# Patient Record
Sex: Female | Born: 1990 | Race: White | Hispanic: No | Marital: Single | Smoking: Current every day smoker
Health system: Southern US, Community
[De-identification: ages and names within clinical notes are randomized; demographics above are authoritative.]

## PROBLEM LIST (undated history)

## (undated) DIAGNOSIS — F419 Anxiety disorder, unspecified: Secondary | ICD-10-CM

## (undated) DIAGNOSIS — B009 Herpesviral infection, unspecified: Secondary | ICD-10-CM

## (undated) DIAGNOSIS — J45909 Unspecified asthma, uncomplicated: Secondary | ICD-10-CM

## (undated) DIAGNOSIS — E039 Hypothyroidism, unspecified: Secondary | ICD-10-CM

## (undated) DIAGNOSIS — G4489 Other headache syndrome: Secondary | ICD-10-CM

## (undated) DIAGNOSIS — G4700 Insomnia, unspecified: Secondary | ICD-10-CM

## (undated) HISTORY — PX: MYRINGOTOMY: SUR874

## (undated) HISTORY — DX: Insomnia, unspecified: G47.00

## (undated) HISTORY — DX: Herpesviral infection, unspecified: B00.9

## (undated) HISTORY — DX: Hypothyroidism, unspecified: E03.9

## (undated) HISTORY — DX: Other headache syndrome: G44.89

## (undated) HISTORY — DX: Anxiety disorder, unspecified: F41.9

## (undated) HISTORY — PX: ADENOIDECTOMY: SUR15

---
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
## 2012-06-07 ENCOUNTER — Ambulatory Visit (INDEPENDENT_AMBULATORY_CARE_PROVIDER_SITE_OTHER): Payer: 59 | Admitting: Internal Medicine

## 2012-06-07 VITALS — BP 114/72 | HR 68 | Temp 98.1°F | Resp 16 | Ht 64.5 in | Wt 193.0 lb

## 2012-06-07 DIAGNOSIS — Z6832 Body mass index (BMI) 32.0-32.9, adult: Secondary | ICD-10-CM

## 2012-06-07 DIAGNOSIS — E049 Nontoxic goiter, unspecified: Secondary | ICD-10-CM

## 2012-06-07 DIAGNOSIS — E039 Hypothyroidism, unspecified: Secondary | ICD-10-CM

## 2012-06-07 LAB — POCT CBC
Granulocyte percent: 62.2 %G (ref 37–80)
HCT, POC: 44 % (ref 37.7–47.9)
Hemoglobin: 13.8 g/dL (ref 12.2–16.2)
Lymph, poc: 2.7 (ref 0.6–3.4)
MCH, POC: 27.3 pg (ref 27–31.2)
MCHC: 31.4 g/dL — AB (ref 31.8–35.4)
MCV: 87 fL (ref 80–97)
MID (cbc): 0.7 (ref 0–0.9)
MPV: 12 fL (ref 0–99.8)
POC Granulocyte: 5.5 (ref 2–6.9)
POC LYMPH PERCENT: 30.2 %L (ref 10–50)
POC MID %: 7.6 %M (ref 0–12)
Platelet Count, POC: 267 10*3/uL (ref 142–424)
RBC: 5.06 M/uL (ref 4.04–5.48)
RDW, POC: 14.2 %
WBC: 8.9 10*3/uL (ref 4.6–10.2)

## 2012-06-07 MED ORDER — TRAZODONE HCL 50 MG PO TABS: 50.0000 mg | ORAL_TABLET | Freq: Every evening | ORAL | Status: DC | PRN

## 2012-06-07 MED ORDER — KETOCONAZOLE 2 % EX CREA: TOPICAL_CREAM | Freq: Every day | CUTANEOUS | Status: AC

## 2012-06-07 MED ORDER — BUPROPION HCL ER (XL) 150 MG PO TB24: ORAL_TABLET | ORAL | Status: DC

## 2012-06-07 NOTE — Progress Notes (Signed)
  Subjective:    Patient ID: Hailey Rowland, female    DOB: 1991-06-11, 21 y.o.   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 Negative for activity change and appetite change.  Eyes: Negative for visual disturbance.  Respiratory: Negative for apnea, choking, shortness of breath and wheezing.   Cardiovascular: Negative for chest pain, palpitations and leg swelling.  Gastrointestinal: Negative for constipation.  Genitourinary: Negative for difficulty urinating.  Skin: Negative for rash.  Neurological: Negative for headaches.  Psychiatric/Behavioral: Positive for disturbed wake/sleep cycle and dysphoric mood. Negative for suicidal ideas, hallucinations, behavioral problems, confusion, self-injury, decreased concentration and agitation. The patient is nervous/anxious. The patient is not hyperactive.        Objective:   Physical ExamVital signs stable Pupils equal round reactive to light and accommodation/EOMs conjugate Large goiter but symmetrical without nodules Lungs clear/heart regular Affect appropriate/mood stable        Results for orders placed in visit on 06/07/12  POCT CBC      Component Value Range   WBC 8.9  4.6 - 10.2 K/uL   Lymph, poc 2.7  0.6 - 3.4   POC LYMPH PERCENT 30.2  10 - 50 %L   MID (cbc) 0.7  0 - 0.9   POC MID % 7.6  0 - 12 %M   POC Granulocyte 5.5  2 - 6.9   Granulocyte percent 62.2  37 - 80 %G   RBC 5.06  4.04 - 5.48 M/uL   Hemoglobin 13.8  12.2 - 16.2 g/dL   HCT, POC 40.9  81.1 - 47.9 %   MCV 87.0  80 - 97 fL   MCH, POC 27.3  27 -  31.2 pg   MCHC 31.4 (*) 31.8 - 35.4 g/dL   RDW, POC 91.4     Platelet Count, POC 267  142 - 424 K/uL   MPV 12.0  0 - 99.8 fL    Assessment & Plan:   1. Hypothyroidism  TSH, T4, free, Lipid panel, US Soft Tissue Head/Neck  2. Goiter  TSH, T4, free, US Soft Tissue Head/Neck  3. BMI 32.0-32.9,adult  POCT CBC, Comprehensive metabolic panel  4. Depression 5. Insomnia 6. Allergic rhinitis  Meds ordered this encounter  Medications  . levothyroxine (SYNTHROID, LEVOTHROID) 200 MCG tablet    Sig: Take 200 mcg by mouth daily.  . montelukast (SINGULAIR) 10 MG tablet    Sig: Take 10 mg by mouth at bedtime.  Marland Kitchen buPROPion (WELLBUTRIN XL) 150 MG 24 hr tablet    Sig: One daily for 4 days then two daily    Dispense:  60 tablet    Refill:  2  . traZODone (DESYREL) 50 MG tablet    Sig: Take 1 tablet (50 mg total) by mouth at bedtime as needed for sleep.    Dispense:  30 tablet    Refill:  2  . ketoconazole (NIZORAL) 2 % cream    Sig: Apply topically daily.    Dispense:  15 g    Refill:  0  Call with lab results and plan Ultrasound thyroid

## 2012-06-08 LAB — LIPID PANEL
Cholesterol: 139 mg/dL (ref 0–169)
HDL: 51 mg/dL (ref >34)
LDL Cholesterol: 75 mg/dL (ref 0–109)
Total CHOL/HDL Ratio: 2.7 Ratio
Triglycerides: 63 mg/dL (ref <150)
VLDL: 13 mg/dL (ref 0–40)

## 2012-06-08 LAB — COMPREHENSIVE METABOLIC PANEL
ALT: 19 U/L (ref 0–35)
AST: 23 U/L (ref 0–37)
Albumin: 4.6 g/dL (ref 3.5–5.2)
Alkaline Phosphatase: 83 U/L (ref 39–117)
BUN: 8 mg/dL (ref 6–23)
CO2: 26 mEq/L (ref 19–32)
Calcium: 10.1 mg/dL (ref 8.4–10.5)
Chloride: 103 mEq/L (ref 96–112)
Creat: 0.9 mg/dL (ref 0.50–1.10)
Glucose, Bld: 85 mg/dL (ref 70–99)
Potassium: 4.7 mEq/L (ref 3.5–5.3)
Sodium: 138 mEq/L (ref 135–145)
Total Bilirubin: 0.5 mg/dL (ref 0.3–1.2)
Total Protein: 7.9 g/dL (ref 6.0–8.3)

## 2012-06-08 LAB — T4, FREE: Free T4: 0.96 ng/dL (ref 0.80–1.80)

## 2012-06-08 LAB — TSH: TSH: 12.051 u[IU]/mL — ABNORMAL HIGH (ref 0.350–4.500)

## 2012-06-10 ENCOUNTER — Encounter: Payer: Self-pay | Admitting: Internal Medicine

## 2012-06-10 ENCOUNTER — Telehealth: Payer: Self-pay | Admitting: Internal Medicine

## 2012-06-10 NOTE — Telephone Encounter (Signed)
The numbers in his demographic profile were all wrong side discontinued all of them and I called the number and other chart=[phone] And left a message For them to confirm 200 mcg daily and that she has been taking it regularly as we're awaiting to schedule the ultrasound to reevaluate her goiter

## 2012-08-10 ENCOUNTER — Other Ambulatory Visit: Payer: Self-pay | Admitting: Physician Assistant

## 2012-10-03 ENCOUNTER — Other Ambulatory Visit: Payer: Self-pay | Admitting: Physician Assistant

## 2012-11-05 ENCOUNTER — Other Ambulatory Visit: Payer: Self-pay | Admitting: *Deleted

## 2012-11-05 MED ORDER — LEVOTHYROXINE SODIUM 200 MCG PO TABS: 200.0000 ug | ORAL_TABLET | Freq: Every day | ORAL | Status: DC

## 2012-12-14 ENCOUNTER — Other Ambulatory Visit: Payer: Self-pay | Admitting: Internal Medicine

## 2012-12-16 ENCOUNTER — Encounter: Payer: Self-pay | Admitting: Family Medicine

## 2012-12-16 ENCOUNTER — Ambulatory Visit (INDEPENDENT_AMBULATORY_CARE_PROVIDER_SITE_OTHER): Payer: 59 | Admitting: Family Medicine

## 2012-12-16 VITALS — BP 103/68 | HR 64 | Temp 98.4°F | Resp 16 | Ht 63.5 in | Wt 172.8 lb

## 2012-12-16 DIAGNOSIS — H6090 Unspecified otitis externa, unspecified ear: Secondary | ICD-10-CM

## 2012-12-16 DIAGNOSIS — J309 Allergic rhinitis, unspecified: Secondary | ICD-10-CM

## 2012-12-16 DIAGNOSIS — E039 Hypothyroidism, unspecified: Secondary | ICD-10-CM

## 2012-12-16 DIAGNOSIS — H60399 Other infective otitis externa, unspecified ear: Secondary | ICD-10-CM

## 2012-12-16 DIAGNOSIS — F319 Bipolar disorder, unspecified: Secondary | ICD-10-CM

## 2012-12-16 LAB — POCT CBC
MCH, POC: 27.1 pg (ref 27–31.2)
MCHC: 29.6 g/dL — AB (ref 31.8–35.4)
MID (cbc): 0.5 (ref 0–0.9)
MPV: 11.2 fL (ref 0–99.8)
POC MID %: 6.8 %M (ref 0–12)
Platelet Count, POC: 202 10*3/uL (ref 142–424)
RBC: 4.76 M/uL (ref 4.04–5.48)
WBC: 7.9 10*3/uL (ref 4.6–10.2)

## 2012-12-16 MED ORDER — OFLOXACIN 0.3 % OT SOLN
10.0000 [drp] | Freq: Every day | OTIC | Status: DC
Start: 2012-12-16 — End: 2013-06-03

## 2012-12-16 MED ORDER — LEVOTHYROXINE SODIUM 200 MCG PO TABS: 200.0000 ug | ORAL_TABLET | Freq: Every day | ORAL | Status: DC

## 2012-12-16 MED ORDER — AMOXICILLIN-POT CLAVULANATE 600-42.9 MG/5ML PO SUSR: 600.0000 mg | Freq: Two times a day (BID) | ORAL | Status: DC

## 2012-12-16 MED ORDER — TRAZODONE HCL 100 MG PO TABS: 100.0000 mg | ORAL_TABLET | Freq: Every day | ORAL | Status: DC

## 2012-12-16 MED ORDER — MONTELUKAST SODIUM 10 MG PO TABS: 10.0000 mg | ORAL_TABLET | Freq: Every day | ORAL | Status: DC

## 2012-12-16 NOTE — Progress Notes (Signed)
[address]   [place], [place]  [address]   [phone]  Subjective:    Patient ID: Hailey Rowland, female    DOB: 08/02/1991, 21 y.o.   MRN: [id number]  HPIThis 21 y.o. female presents for six month follow-up for the following:  1.  Bipolar disorder: diagnosed by Dr. Merla Riches years ago.  Started Trazodone 50mg  qhs.  No side effects; makes sleepy at night.  Interested in going up on dose.  +Moodiness .  Graduated last year; no job; cannot find a job.  Waste of time.  No SI/HI.   Living with parents.  Good family support.  Not taking Wellbutrin.  2.  Hypothyroidism:  Due for repeat labs.  Reports good compliance with medication, good tolerance to medication; good symptom control.   Did not undergo thyroid ultrasound after last visit; previous thyroid ultrasound 2002 +multinodular goiter without discrete nodule.  3.  Allergic Rhinitis: due for refill of Singulair.  Stable with medication.  4.  R ear pain:  Onset a few days ago; normal hearing; no drainage; +pain with laying on ear.  No fever/chills/sweats.  +nasal congestion; no rhinorrhea; no sore throat; no cough..  No v/d.    Review of Systems  Constitutional: Negative for fever, chills, diaphoresis and fatigue.  HENT: Positive for ear pain, congestion, sneezing and postnasal drip. Negative for hearing loss, sore throat, rhinorrhea, trouble swallowing, voice change, sinus pressure, tinnitus and ear discharge.   Respiratory: Negative for shortness of breath.   Cardiovascular: Negative for chest pain, palpitations and leg swelling.  Gastrointestinal: Negative for nausea, vomiting and diarrhea.  Endocrine: Negative for cold intolerance, heat intolerance, polydipsia, polyphagia and polyuria.  Skin: Negative for color change, pallor and rash.  Psychiatric/Behavioral: Positive for sleep disturbance and dysphoric mood. Negative for suicidal ideas, behavioral problems, self-injury and agitation. The patient is nervous/anxious.     Past  Medical History  Diagnosis Date  . Anxiety     Past Surgical History  Procedure Laterality Date  . Adenoidectomy    . Myringotomy      Prior to Admission medications   Medication Sig Start Date End Date Taking? Authorizing Provider  levothyroxine (SYNTHROID, LEVOTHROID) 200 MCG tablet Take 1 tablet (200 mcg total) by mouth daily. 12/16/12  Yes Ethelda Chick, MD  montelukast (SINGULAIR) 10 MG tablet Take 1 tablet (10 mg total) by mouth at bedtime. 12/16/12  Yes Ethelda Chick, MD  traZODone (DESYREL) 100 MG tablet Take 1 tablet (100 mg total) by mouth at bedtime. Need office visit & lab for additional refills. 12/16/12  Yes Ethelda Chick, MD  amoxicillin-clavulanate (AUGMENTIN ES-600) 600-42.9 MG/5ML suspension Take 5 mLs (600 mg total) by mouth 2 (two) times daily. 12/16/12   Ethelda Chick, MD  buPROPion (WELLBUTRIN XL) 150 MG 24 hr tablet One daily for 4 days then two daily 06/07/12   Tonye Pearson, MD  ketoconazole (NIZORAL) 2 % cream Apply topically daily. 06/07/12 06/07/13  Tonye Pearson, MD  ofloxacin (FLOXIN OTIC) 0.3 % otic solution Place 10 drops into the right ear daily. 12/16/12   Ethelda Chick, MD    Allergies  Allergen Reactions  . Antihistamines, Chlorpheniramine-Type     History   Social History  . Marital Status: Single    Spouse Name: N/A    Number of Children: N/A  . Years of Education: N/A   Occupational History  . Not on file.   Social History Main Topics  . Smoking status:  Never Smoker   . Smokeless tobacco: Not on file  . Alcohol Use: Not on file  . Drug Use: Not on file  . Sexually Active: Not on file   Other Topics Concern  . Not on file   Social History Narrative   Marital: single; dating steady boyfriend x 4 months      Children: none      Lives: with mom and dad      Employment:  Unemployed; graduated from high school in 2012      Tobacco    No family history on file.     Objective:   Physical Exam  Nursing note and  vitals reviewed. Constitutional: She is oriented to person, place, and time. She appears well-developed and well-nourished. No distress.  HENT:  Head: Normocephalic and atraumatic.  Right Ear: No lacerations. There is drainage, swelling and tenderness. No foreign bodies. No mastoid tenderness. Tympanic membrane is not injected, not perforated and not erythematous. No middle ear effusion.  Left Ear: External ear normal.  Nose: Nose normal.  Mouth/Throat: Oropharynx is clear and moist. No oropharyngeal exudate.  Eyes: Conjunctivae and EOM are normal. Pupils are equal, round, and reactive to light.  Neck: Normal range of motion. Neck supple. Thyromegaly present.  Cardiovascular: Normal rate, regular rhythm and normal heart sounds.  Exam reveals no gallop and no friction rub.   No murmur heard. Pulmonary/Chest: Effort normal and breath sounds normal. No respiratory distress. She has no wheezes. She has no rales.  Lymphadenopathy:    She has cervical adenopathy.  Neurological: She is alert and oriented to person, place, and time. No cranial nerve deficit. She exhibits normal muscle tone. Coordination normal.  Skin: She is not diaphoretic.  Psychiatric: She has a normal mood and affect. Her behavior is normal. Judgment and thought content normal.       Assessment & Plan:  Unspecified hypothyroidism - Plan: POCT CBC, TSH, Comprehensive metabolic panel  Bipolar 1 disorder  Allergic rhinitis, cause unspecified  Otitis externa    1. Hypothyroidism:  Uncontrolled; obtain labs; refill provided.  Thyroid ultrasound ordered at last visit but cancelled; previous thyroid ultrasound performed in 2002 which revealed multinodular goiter. 2.  Bipolar Disorder:  Stable; agreeable to increasing Trazodone to 100mg  qhs.  Follow-up with Dr. Merla Riches in 2-3 months.  Not taking Wellbutrin currently but feels stable. 3.  Allergic Rhinitis: controlled; refill of Singulair provided. 4. Otitis Externa:  New.   Rx for Augmentin, Floxin Otic provided.  Supportive care with Ibuprofen or Tylenol.     Meds ordered this encounter  Medications  . traZODone (DESYREL) 100 MG tablet    Sig: Take 1 tablet (100 mg total) by mouth at bedtime. Need office visit & lab for additional refills.    Dispense:  30 tablet    Refill:  5  . levothyroxine (SYNTHROID, LEVOTHROID) 200 MCG tablet    Sig: Take 1 tablet (200 mcg total) by mouth daily.    Dispense:  30 tablet    Refill:  5  . montelukast (SINGULAIR) 10 MG tablet    Sig: Take 1 tablet (10 mg total) by mouth at bedtime.    Dispense:  30 tablet    Refill:  5  . amoxicillin-clavulanate (AUGMENTIN ES-600) 600-42.9 MG/5ML suspension    Sig: Take 5 mLs (600 mg total) by mouth 2 (two) times daily.    Dispense:  200 mL    Refill:  0  . ofloxacin (FLOXIN OTIC) 0.3 % otic solution  Sig: Place 10 drops into the right ear daily.    Dispense:  10 mL    Refill:  0

## 2012-12-17 LAB — COMPREHENSIVE METABOLIC PANEL
AST: 17 U/L (ref 0–37)
Alkaline Phosphatase: 73 U/L (ref 39–117)
BUN: 10 mg/dL (ref 6–23)
Creat: 0.84 mg/dL (ref 0.50–1.10)
Potassium: 4.6 mEq/L (ref 3.5–5.3)
Total Bilirubin: 0.4 mg/dL (ref 0.3–1.2)

## 2013-01-03 NOTE — Progress Notes (Signed)
Left msg for pt to schedule 2 month f-up with Dr. Merla Riches.

## 2013-01-09 NOTE — Progress Notes (Signed)
No response from pt, sent pt reminder letter to schedule 2 month f-up with Dr. Merla Riches.

## 2013-02-19 NOTE — Progress Notes (Signed)
Reviewed and agree.

## 2013-06-03 ENCOUNTER — Ambulatory Visit (INDEPENDENT_AMBULATORY_CARE_PROVIDER_SITE_OTHER): Payer: BC Managed Care – PPO | Admitting: Internal Medicine

## 2013-06-03 VITALS — BP 119/81 | HR 72 | Temp 97.8°F | Resp 16 | Ht 64.0 in | Wt 168.4 lb

## 2013-06-03 DIAGNOSIS — L708 Other acne: Secondary | ICD-10-CM

## 2013-06-03 DIAGNOSIS — L709 Acne, unspecified: Secondary | ICD-10-CM

## 2013-06-03 DIAGNOSIS — F341 Dysthymic disorder: Secondary | ICD-10-CM

## 2013-06-03 DIAGNOSIS — E039 Hypothyroidism, unspecified: Secondary | ICD-10-CM

## 2013-06-03 MED ORDER — CITALOPRAM HYDROBROMIDE 20 MG PO TABS: 20.0000 mg | ORAL_TABLET | Freq: Every day | ORAL | Status: DC

## 2013-06-03 MED ORDER — CLINDAMYCIN PHOS-BENZOYL PEROX 1-5 % EX GEL: Freq: Two times a day (BID) | CUTANEOUS | Status: AC
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
## 2013-06-04 LAB — TSH: TSH: 0.158 u[IU]/mL — ABNORMAL LOW (ref 0.350–4.500)

## 2013-06-04 LAB — T4, FREE: Free T4: 1.65 ng/dL (ref 0.80–1.80)

## 2013-06-05 DIAGNOSIS — E039 Hypothyroidism, unspecified: Secondary | ICD-10-CM | POA: Insufficient documentation

## 2013-06-05 MED ORDER — LEVOTHYROXINE SODIUM 200 MCG PO TABS: 200.0000 ug | ORAL_TABLET | Freq: Every day | ORAL | Status: DC

## 2013-06-08 ENCOUNTER — Encounter: Payer: Self-pay | Admitting: Internal Medicine

## 2013-07-30 ENCOUNTER — Emergency Department (HOSPITAL_COMMUNITY)
Admission: EM | Admit: 2013-07-30 | Discharge: 2013-07-30 | Disposition: A | Discharge status: Home / Self Care | Payer: No Typology Code available for payment source | Attending: Emergency Medicine | Admitting: Emergency Medicine

## 2013-07-30 ENCOUNTER — Encounter (HOSPITAL_COMMUNITY): Payer: Self-pay

## 2013-07-30 ENCOUNTER — Emergency Department (HOSPITAL_COMMUNITY): Payer: No Typology Code available for payment source

## 2013-07-30 DIAGNOSIS — E079 Disorder of thyroid, unspecified: Secondary | ICD-10-CM | POA: Insufficient documentation

## 2013-07-30 DIAGNOSIS — M25522 Pain in left elbow: Secondary | ICD-10-CM

## 2013-07-30 DIAGNOSIS — Y92410 Unspecified street and highway as the place of occurrence of the external cause: Secondary | ICD-10-CM | POA: Insufficient documentation

## 2013-07-30 DIAGNOSIS — S4980XA Other specified injuries of shoulder and upper arm, unspecified arm, initial encounter: Secondary | ICD-10-CM | POA: Insufficient documentation

## 2013-07-30 DIAGNOSIS — F411 Generalized anxiety disorder: Secondary | ICD-10-CM | POA: Insufficient documentation

## 2013-07-30 DIAGNOSIS — S46909A Unspecified injury of unspecified muscle, fascia and tendon at shoulder and upper arm level, unspecified arm, initial encounter: Secondary | ICD-10-CM | POA: Insufficient documentation

## 2013-07-30 DIAGNOSIS — Y9389 Activity, other specified: Secondary | ICD-10-CM | POA: Insufficient documentation

## 2013-07-30 DIAGNOSIS — S99929A Unspecified injury of unspecified foot, initial encounter: Secondary | ICD-10-CM | POA: Insufficient documentation

## 2013-07-30 DIAGNOSIS — S99919A Unspecified injury of unspecified ankle, initial encounter: Secondary | ICD-10-CM | POA: Insufficient documentation

## 2013-07-30 DIAGNOSIS — S6990XA Unspecified injury of unspecified wrist, hand and finger(s), initial encounter: Secondary | ICD-10-CM | POA: Insufficient documentation

## 2013-07-30 DIAGNOSIS — S8990XA Unspecified injury of unspecified lower leg, initial encounter: Secondary | ICD-10-CM | POA: Insufficient documentation

## 2013-07-30 DIAGNOSIS — Z79899 Other long term (current) drug therapy: Secondary | ICD-10-CM | POA: Insufficient documentation

## 2013-07-30 DIAGNOSIS — M25561 Pain in right knee: Secondary | ICD-10-CM

## 2013-07-30 DIAGNOSIS — S59909A Unspecified injury of unspecified elbow, initial encounter: Secondary | ICD-10-CM | POA: Insufficient documentation
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
## 2013-07-30 NOTE — Progress Notes (Signed)
[facility] tried to provided patient with a list of primary care resources, but patient and her family stated that she already had a pcp and did not need any information.

## 2013-08-01 NOTE — ED Provider Notes (Signed)
History/physical exam/procedure(s) were performed by non-physician practitioner and as supervising physician I was immediately available for consultation/collaboration. I have reviewed all notes and am in agreement with care and plan.   Hilario Quarry, MD 08/01/13 2158

## 2013-09-04 ENCOUNTER — Ambulatory Visit (INDEPENDENT_AMBULATORY_CARE_PROVIDER_SITE_OTHER): Payer: BC Managed Care – PPO | Admitting: Family Medicine

## 2013-09-04 VITALS — BP 104/70 | HR 90 | Temp 98.9°F | Resp 20 | Ht 64.25 in | Wt 177.0 lb

## 2013-09-04 DIAGNOSIS — N760 Acute vaginitis: Secondary | ICD-10-CM

## 2013-09-04 DIAGNOSIS — Z9189 Other specified personal risk factors, not elsewhere classified: Secondary | ICD-10-CM

## 2013-09-04 DIAGNOSIS — Z202 Contact with and (suspected) exposure to infections with a predominantly sexual mode of transmission: Secondary | ICD-10-CM

## 2013-09-04 DIAGNOSIS — IMO0001 Reserved for inherently not codable concepts without codable children: Secondary | ICD-10-CM

## 2013-09-04 DIAGNOSIS — N898 Other specified noninflammatory disorders of vagina: Secondary | ICD-10-CM

## 2013-09-04 DIAGNOSIS — Z3009 Encounter for other general counseling and advice on contraception: Secondary | ICD-10-CM

## 2013-09-04 DIAGNOSIS — N949 Unspecified condition associated with female genital organs and menstrual cycle: Secondary | ICD-10-CM

## 2013-09-04 DIAGNOSIS — R102 Pelvic and perineal pain: Secondary | ICD-10-CM

## 2013-09-04 LAB — POCT WET PREP WITH KOH
KOH Prep POC: NEGATIVE
Trichomonas, UA: NEGATIVE
Yeast Wet Prep HPF POC: NEGATIVE

## 2013-09-04 LAB — POCT UA - MICROSCOPIC ONLY
Casts, Ur, LPF, POC: NEGATIVE
Crystals, Ur, HPF, POC: NEGATIVE
Yeast, UA: NEGATIVE

## 2013-09-04 LAB — POCT URINALYSIS DIPSTICK
Bilirubin, UA: NEGATIVE
Blood, UA: NEGATIVE
Glucose, UA: NEGATIVE
Ketones, UA: NEGATIVE
Nitrite, UA: NEGATIVE
Protein, UA: NEGATIVE
Spec Grav, UA: 1.015
Urobilinogen, UA: 1
pH, UA: 7

## 2013-09-04 LAB — POCT URINE PREGNANCY: Preg Test, Ur: NEGATIVE

## 2013-09-04 MED ORDER — MEDROXYPROGESTERONE ACETATE 150 MG/ML IM SUSP
150.0000 mg | Freq: Once | INTRAMUSCULAR | Status: AC
  Administered 2013-09-04: 150 mg via INTRAMUSCULAR

## 2013-09-04 MED ORDER — METRONIDAZOLE 500 MG PO TABS: 500.0000 mg | ORAL_TABLET | Freq: Two times a day (BID) | ORAL | Status: DC

## 2013-09-04 MED ORDER — FLUCONAZOLE 150 MG PO TABS: 150.0000 mg | ORAL_TABLET | Freq: Once | ORAL | Status: DC
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
## 2013-09-04 NOTE — Patient Instructions (Signed)
Bacterial Vaginosis Bacterial vaginosis (BV) is a vaginal infection where the normal balance of bacteria in the vagina is disrupted. The normal balance is then replaced by an overgrowth of certain bacteria. There are several different kinds of bacteria that can cause BV. BV is the most common vaginal infection in women of childbearing age. CAUSES   The cause of BV is not fully understood. BV develops when there is an increase or imbalance of harmful bacteria.  Some activities or behaviors can upset the normal balance of bacteria in the vagina and put women at increased risk including:  Having a new sex partner or multiple sex partners.  Douching.  Using an intrauterine device (IUD) for contraception.  It is not clear what role sexual activity plays in the development of BV. However, women that have never had sexual intercourse are rarely infected with BV. Women do not get BV from toilet seats, bedding, swimming pools or from touching objects around them.  SYMPTOMS   Grey vaginal discharge.  A fish-like odor with discharge, especially after sexual intercourse.  Itching or burning of the vagina and vulva.  Burning or pain with urination.  Some women have no signs or symptoms at all. DIAGNOSIS  Your caregiver must examine the vagina for signs of BV. Your caregiver will perform lab tests and look at the sample of vaginal fluid through a microscope. They will look for bacteria and abnormal cells (clue cells), a pH test higher than 4.5, and a positive amine test all associated with BV.  RISKS AND COMPLICATIONS   Pelvic inflammatory disease (PID).  Infections following gynecology surgery.  Developing HIV.  Developing herpes virus. TREATMENT  Sometimes BV will clear up without treatment. However, all women with symptoms of BV should be treated to avoid complications, especially if gynecology surgery is planned. Female partners generally do not need to be treated. However, BV may spread  between female sex partners so treatment is helpful in preventing a recurrence of BV.   BV may be treated with antibiotics. The antibiotics come in either pill or vaginal cream forms. Either can be used with nonpregnant or pregnant women, but the recommended dosages differ. These antibiotics are not harmful to the baby.  BV can recur after treatment. If this happens, a second round of antibiotics will often be prescribed.  Treatment is important for pregnant women. If not treated, BV can cause a premature delivery, especially for a pregnant woman who had a premature birth in the past. All pregnant women who have symptoms of BV should be checked and treated.  For chronic reoccurrence of BV, treatment with a type of prescribed gel vaginally twice a week is helpful. HOME CARE INSTRUCTIONS   Finish all medication as directed by your caregiver.  Do not have sex until treatment is completed.  Tell your sexual partner that you have a vaginal infection. They should see their caregiver and be treated if they have problems, such as a mild rash or itching.  Practice safe sex. Use condoms. Only have 1 sex partner. PREVENTION  Basic prevention steps can help reduce the risk of upsetting the natural balance of bacteria in the vagina and developing BV:  Do not have sexual intercourse (be abstinent).  Do not douche.  Use all of the medicine prescribed for treatment of BV, even if the signs and symptoms go away.  Tell your sex partner if you have BV. That way, they can be treated, if needed, to prevent reoccurrence. SEEK MEDICAL CARE IF:     Your symptoms are not improving after 3 days of treatment.  You have increased discharge, pain, or fever. MAKE SURE YOU:   Understand these instructions.  Will watch your condition.  Will get help right away if you are not doing well or get worse. FOR MORE INFORMATION  Division of STD Prevention (DSTDP), Centers for Disease Control and Prevention:  www.cdc.gov/std American Social Health Association (ASHA): www.ashastd.org  Document Released: 12/11/2005 Document Revised: 03/04/2012 Document Reviewed: 06/03/2009 Exit[employer] Patient Information 2014 [employer].  

## 2013-09-05 LAB — RPR

## 2013-09-05 LAB — HIV ANTIBODY (ROUTINE TESTING W REFLEX): HIV: NONREACTIVE

## 2013-09-06 LAB — URINE CULTURE: Colony Count: 50000

## 2013-09-07 ENCOUNTER — Other Ambulatory Visit: Payer: Self-pay | Admitting: Family Medicine

## 2013-09-07 DIAGNOSIS — F341 Dysthymic disorder: Secondary | ICD-10-CM

## 2013-09-07 MED ORDER — LEVOTHYROXINE SODIUM 200 MCG PO TABS: 200.0000 ug | ORAL_TABLET | Freq: Every day | ORAL | Status: DC

## 2013-09-07 MED ORDER — CITALOPRAM HYDROBROMIDE 20 MG PO TABS: 20.0000 mg | ORAL_TABLET | Freq: Every day | ORAL | Status: DC

## 2013-09-07 NOTE — Telephone Encounter (Signed)
SEnt.  Per last OV note from Dr. Merla Riches, pt to follow up in 6-8 wks to discuss celexa.  Pt was here recently for acute visit only.  Needs to follow up with Dr. Merla Riches

## 2013-09-07 NOTE — Telephone Encounter (Signed)
Patient called stated  Needs refill on meds Trazodone, Celexa & Levothyroxine. Patient stated she was seen just several days ago. [facility] on [address] patient may be reached at [phone] if questions.

## 2013-09-08 LAB — HERPES SIMPLEX VIRUS CULTURE: Organism ID, Bacteria: DETECTED

## 2013-09-08 NOTE — Telephone Encounter (Signed)
Notified pt that RFs were sent but still needs to RTC for f/up w/Dr Merla Riches. Pt agreed.

## 2013-09-09 ENCOUNTER — Telehealth: Payer: Self-pay

## 2013-09-09 ENCOUNTER — Telehealth: Payer: Self-pay | Admitting: Family Medicine

## 2013-09-09 DIAGNOSIS — B009 Herpesviral infection, unspecified: Secondary | ICD-10-CM

## 2013-09-09 MED ORDER — VALACYCLOVIR HCL 1 G PO TABS: 1000.0000 mg | ORAL_TABLET | Freq: Two times a day (BID) | ORAL | Status: DC

## 2013-09-09 NOTE — Telephone Encounter (Signed)
Patient called for lab results.  Spoke with patient and advised her of labs.  (Dr. Conley Rolls had message for patient in her chart regarding labs.)  Patient states understanding.

## 2013-09-09 NOTE — Telephone Encounter (Signed)
LM that she needs to call us back to discuss results. Clinical/lab  staff please let her know She has HSV 2--Gentital Herpes, nothing else was positive ie HIV, Syphilis, G/C. Urine was negative as well. She can stop taking the Flagyl. I have prescribed her Valtrex to take twice a day for 10 days. Sent into pharmacy. Please tell her this is a sexually transmitted disease, if she is having outbreaks then or any itching/pain in her vaginal area then she needs to not have sex until resolved, vvirus can shed up to 1 week after lesions are gone. Always use condoms. She can get more info at www.[employer].fi website.

## 2013-09-10 LAB — GC/CHLAMYDIA PROBE AMP
CT Probe RNA: NEGATIVE
GC Probe RNA: NEGATIVE

## 2013-09-15 ENCOUNTER — Telehealth: Payer: Self-pay

## 2013-09-15 NOTE — Telephone Encounter (Signed)
Pt does need more medication.  I will call the patient in the am.

## 2013-09-15 NOTE — Telephone Encounter (Signed)
Pt states she is having side effects from taking valtrex(agressive,depressed,itching,cyring etc) pt stopped  Medications and now feels better,but does she need to take a different rx???   Best phone for pt is [phone]

## 2013-09-15 NOTE — Telephone Encounter (Signed)
Pt uses [facility] on [address].

## 2013-09-15 NOTE — Telephone Encounter (Signed)
Please advise, patient states she can not tolerate the valtrex.

## 2013-09-16 NOTE — Telephone Encounter (Signed)
Spoke with Hailey Rowland regarding Valtrex and genital herpes. She is completely asymptomatic, advise that this is viral so if she is asymptomatic then can stop Valtrex since having [increase psychiatric issues. She probably does need to be treated longer but will stop for now, due to having psych issues. Already has had 5 doses at least of meds. Advise to get more info about HSV on cdc website in addition to what her friend has been telling her.

## 2013-09-30 ENCOUNTER — Ambulatory Visit: Payer: BC Managed Care – PPO

## 2013-09-30 ENCOUNTER — Ambulatory Visit (INDEPENDENT_AMBULATORY_CARE_PROVIDER_SITE_OTHER): Payer: BC Managed Care – PPO | Admitting: Internal Medicine

## 2013-09-30 VITALS — BP 116/68 | HR 94 | Temp 98.7°F | Resp 18 | Ht 64.0 in | Wt 170.6 lb

## 2013-09-30 DIAGNOSIS — H6691 Otitis media, unspecified, right ear: Secondary | ICD-10-CM

## 2013-09-30 DIAGNOSIS — M79641 Pain in right hand: Secondary | ICD-10-CM

## 2013-09-30 DIAGNOSIS — B009 Herpesviral infection, unspecified: Secondary | ICD-10-CM | POA: Insufficient documentation

## 2013-09-30 DIAGNOSIS — M79609 Pain in unspecified limb: Secondary | ICD-10-CM

## 2013-09-30 DIAGNOSIS — H6690 Otitis media, unspecified, unspecified ear: Secondary | ICD-10-CM

## 2013-09-30 DIAGNOSIS — F341 Dysthymic disorder: Secondary | ICD-10-CM

## 2013-09-30 MED ORDER — AMOXICILLIN 500 MG PO CAPS: 1000.0000 mg | ORAL_CAPSULE | Freq: Two times a day (BID) | ORAL | Status: AC

## 2013-09-30 MED ORDER — TRAZODONE HCL 100 MG PO TABS: ORAL_TABLET | ORAL | Status: DC

## 2013-09-30 MED ORDER — NEOMYCIN-POLYMYXIN-HC 3.5-10000-1 OT SUSP: 3.0000 [drp] | Freq: Four times a day (QID) | OTIC | Status: DC

## 2013-09-30 MED ORDER — VALACYCLOVIR HCL 1 G PO TABS: 1000.0000 mg | ORAL_TABLET | Freq: Two times a day (BID) | ORAL | Status: DC

## 2013-09-30 MED ORDER — CITALOPRAM HYDROBROMIDE 40 MG PO TABS: 40.0000 mg | ORAL_TABLET | Freq: Every day | ORAL | Status: DC
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
## 2013-10-22 ENCOUNTER — Ambulatory Visit: Payer: BC Managed Care – PPO | Admitting: Internal Medicine

## 2013-11-09 ENCOUNTER — Ambulatory Visit: Payer: BC Managed Care – PPO | Admitting: Family Medicine

## 2013-11-09 DIAGNOSIS — Z309 Encounter for contraceptive management, unspecified: Secondary | ICD-10-CM

## 2013-11-09 NOTE — Progress Notes (Signed)
  Subjective:    Patient ID: Hailey Rowland, female    DOB: 1991/06/05, 22 y.o.   MRN: [id number]  HPI Patient came into office for a depo shot she was to early Dr Dareen Piano said that it was okay for her to come into on the 26th of this month to get it because she has an appt with Dr Yong Channel that day shell be just a day early to get shot   Review of Systems     Objective:   Physical Exam        Assessment & Plan:

## 2013-11-19 ENCOUNTER — Ambulatory Visit (INDEPENDENT_AMBULATORY_CARE_PROVIDER_SITE_OTHER): Payer: BC Managed Care – PPO | Admitting: Internal Medicine

## 2013-11-19 VITALS — BP 122/69 | HR 82 | Temp 97.7°F | Resp 16 | Ht 64.0 in | Wt 171.0 lb

## 2013-11-19 DIAGNOSIS — R112 Nausea with vomiting, unspecified: Secondary | ICD-10-CM

## 2013-11-19 DIAGNOSIS — Z309 Encounter for contraceptive management, unspecified: Secondary | ICD-10-CM

## 2013-11-19 DIAGNOSIS — G4700 Insomnia, unspecified: Secondary | ICD-10-CM

## 2013-11-19 DIAGNOSIS — F411 Generalized anxiety disorder: Secondary | ICD-10-CM

## 2013-11-19 DIAGNOSIS — Z23 Encounter for immunization: Secondary | ICD-10-CM

## 2013-11-19 DIAGNOSIS — E039 Hypothyroidism, unspecified: Secondary | ICD-10-CM

## 2013-11-19 DIAGNOSIS — IMO0001 Reserved for inherently not codable concepts without codable children: Secondary | ICD-10-CM

## 2013-11-19 MED ORDER — MEDROXYPROGESTERONE ACETATE 150 MG/ML IM SUSP
150.0000 mg | Freq: Once | INTRAMUSCULAR | Status: AC
  Administered 2013-11-19: 150 mg via INTRAMUSCULAR

## 2013-11-19 MED ORDER — MONTELUKAST SODIUM 10 MG PO TABS: 10.0000 mg | ORAL_TABLET | Freq: Every day | ORAL | Status: DC

## 2013-11-19 MED ORDER — ALPRAZOLAM ER 1 MG PO TB24: 1.0000 mg | ORAL_TABLET | Freq: Every day | ORAL | Status: DC

## 2013-11-19 NOTE — Progress Notes (Signed)
   Subjective:    Patient ID: Hailey Rowland, female    DOB: Jun 09, 1991, 22 y.o.   MRN: [id number]  HPIf/u Patient Active Problem List   Diagnosis Date Noted  . Unspecified hypothyroidism 06/05/2013    Priority: Medium  . 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 Type  celexa helps a lot tho still has lots of daytime "stress" that also prevents falling asleep  BF "steppin' up"  Here for derpo as well--no complaints   Review of Systems  Constitutional: Negative for activity change, appetite change, fatigue and unexpected weight change.  Respiratory: Negative for chest tightness and shortness of breath.   Cardiovascular: Negative for chest pain, palpitations and leg swelling.  Endocrine:       Goiter smaller acc to mom  Genitourinary: Negative for vaginal discharge, difficulty urinating, menstrual problem and pelvic pain.  Neurological: Negative for tremors, weakness and headaches.  Psychiatric/Behavioral: Positive for sleep disturbance and dysphoric mood. Negative for suicidal ideas, hallucinations, confusion, self-injury and decreased concentration. The patient is nervous/anxious.        Objective:   Physical Exam  Constitutional: She is oriented to person, place, and time. She appears well-developed and well-nourished. No distress.  HENT:  Left Ear: External ear normal.  Nose: Nose normal.  Mouth/Throat: Oropharynx is clear and moist.  R Tm now better--opaque w/ landmarks--wax in canal Hearing intact  Eyes: Conjunctivae are normal. Pupils are equal, round, and reactive to light.  Neck: Normal range of motion. Neck supple. No thyromegaly present.  Cardiovascular: Normal rate, regular rhythm, normal heart sounds and intact distal pulses.   No murmur heard. Pulmonary/Chest: Effort normal and breath sounds normal.  Lymphadenopathy:    She has  no cervical adenopathy.  Neurological: She is alert and oriented to person, place, and time. No cranial nerve deficit.  Psychiatric: She has a normal mood and affect. Her behavior is normal. Judgment and thought content normal.  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

## 2013-12-07 ENCOUNTER — Encounter: Payer: Self-pay | Admitting: Internal Medicine

## 2013-12-07 ENCOUNTER — Ambulatory Visit (INDEPENDENT_AMBULATORY_CARE_PROVIDER_SITE_OTHER): Payer: BC Managed Care – PPO

## 2013-12-07 ENCOUNTER — Ambulatory Visit (INDEPENDENT_AMBULATORY_CARE_PROVIDER_SITE_OTHER): Payer: BC Managed Care – PPO | Admitting: Internal Medicine

## 2013-12-07 VITALS — BP 110/70 | HR 88 | Temp 98.2°F | Resp 16 | Ht 64.0 in | Wt 170.0 lb

## 2013-12-07 DIAGNOSIS — R059 Cough, unspecified: Secondary | ICD-10-CM

## 2013-12-07 DIAGNOSIS — I889 Nonspecific lymphadenitis, unspecified: Secondary | ICD-10-CM

## 2013-12-07 DIAGNOSIS — R05 Cough: Secondary | ICD-10-CM

## 2013-12-07 DIAGNOSIS — E039 Hypothyroidism, unspecified: Secondary | ICD-10-CM

## 2013-12-07 LAB — POCT CBC
Granulocyte percent: 73.7 %G (ref 37–80)
HCT, POC: 41.6 % (ref 37.7–47.9)
Lymph, poc: 2 (ref 0.6–3.4)
MCH, POC: 28.7 pg (ref 27–31.2)
MCV: 92.6 fL (ref 80–97)
MID (cbc): 0.8 (ref 0–0.9)
POC LYMPH PERCENT: 18.7 %L (ref 10–50)
Platelet Count, POC: 215 10*3/uL (ref 142–424)
RDW, POC: 13.5 %
WBC: 10.8 10*3/uL — AB (ref 4.6–10.2)

## 2013-12-07 LAB — COMPREHENSIVE METABOLIC PANEL
Albumin: 4.3 g/dL (ref 3.5–5.2)
Alkaline Phosphatase: 75 U/L (ref 39–117)
BUN: 10 mg/dL (ref 6–23)
Calcium: 9.2 mg/dL (ref 8.4–10.5)
Creat: 0.84 mg/dL (ref 0.50–1.10)
Glucose, Bld: 100 mg/dL — ABNORMAL HIGH (ref 70–99)
Potassium: 4.3 mEq/L (ref 3.5–5.3)

## 2013-12-07 LAB — T4, FREE: Free T4: 1.26 ng/dL (ref 0.80–1.80)

## 2013-12-07 MED ORDER — HYDROCODONE-HOMATROPINE 5-1.5 MG/5ML PO SYRP: 5.0000 mL | ORAL_SOLUTION | Freq: Four times a day (QID) | ORAL | Status: DC | PRN

## 2013-12-07 MED ORDER — AZITHROMYCIN 500 MG PO TABS: 500.0000 mg | ORAL_TABLET | Freq: Every day | ORAL | Status: DC

## 2013-12-07 NOTE — Progress Notes (Addendum)
Subjective:   This chart was scribed for Hailey Sia, MD at [facility] by Yevette Edwards, Scribe. This pt's care was started at 1:12 PM.   Patient ID: Hailey Rowland, female    DOB: [birth year], 22 y.o.   MRN: [id number]  Chief Complaint  Patient presents with  . swollen lymphnode in neck  . Cough  . Headache  . Fatigue   Patient Active Problem List   Diagnosis Date Noted  . 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 Unspecified hypothyroidism    06/05/2013    -  Reactive anxiety HPI  Hailey Rowland is a 22 y.o. female who presents to [facility] complaining of a swollen lymph node to her neck, with associated tenderness, which began approximately 7 days ago. The pt has experienced a fever, a persistent cough productive of phlegm, a headache, intermittent SOB, and fatigue. Her mother reports the pt's cough "sounds like a smoker's cough." In the office, her temperature is 98.2 F.  She reports that her boyfriend recently was sick, but he did not visit the doctor's due to lack of insurance. He is currently improved. The pt denies pain with swallowing, dental pain, otalgia, wheezing, and chest pain.    Her thyroid levels were last assessed six months ago in June, 2014.   She states the long-acting xanax is working well.   The pt denies any possibility of pregnancy.   Review of Systems  Constitutional: Positive for fever, activity change and fatigue.  HENT: Negative for dental problem and ear pain.   Respiratory: Positive for cough and shortness of breath. Negative for wheezing.   Cardiovascular: Negative for chest pain.  Neurological: Positive for headaches.      Objective:   Physical Exam  Nursing note and vitals reviewed. Constitutional: She is oriented to person, place, and time. She appears well-developed and well-nourished. No distress.  HENT:  Head: Normocephalic and atraumatic.  Mild injection thr 3cm tender R ac node high in ac  chain No dental issues  Eyes: Conjunctivae and EOM are normal. Pupils are equal, round, and reactive to light.  Neck: Neck supple.  Cardiovascular: Normal rate, regular rhythm and normal heart sounds.   No murmur heard. Pulmonary/Chest: Effort normal. No respiratory distress.  Rhonchi both bases  Musculoskeletal: Normal range of motion.  Neurological: She is alert and oriented to person, place, and time.  Skin: Skin is warm and dry. No rash noted.   Filed Vitals:   12/07/13 1250  BP: 110/70  Pulse: 88  Temp: 98.2 F (36.8 C)  TempSrc: Oral  Resp: 16  Height: 5\' 4"  (1.626 m)  Weight: 170 lb (77.111 kg)  SpO2: 99%   [facility] reading (PRIMARY) by  Dr. Doolittle=mild incr markings RLL  Results for orders placed in visit on 12/07/13  POCT CBC      Result Value Range   WBC 10.8 (*) 4.6 - 10.2 K/uL   Lymph, poc 2.0  0.6 - 3.4   POC LYMPH PERCENT 18.7  10 - 50 %L   MID (cbc) 0.8  0 - 0.9   POC MID % 7.6  0 - 12 %M   POC Granulocyte 8.0 (*) 2 - 6.9   Granulocyte percent 73.7  37 - 80 %G   RBC 4.49  4.04 - 5.48 M/uL   Hemoglobin 12.9  12.2 - 16.2 g/dL   HCT, POC 57.8  46.9 - 47.9 %   MCV 92.6  80 - 97 fL  MCH, POC 28.7  27 - 31.2 pg   MCHC 31.0 (*) 31.8 - 35.4 g/dL   RDW, POC 16.1     Platelet Count, POC 215  142 - 424 K/uL   MPV 11.4  0 - 99.8 fL       Assessment & Plan:   1:21 PM- Discussed treatment plan, which includes a CBC, TSH, free T 4, and chest x-rays, with patient, and the patient agreed to the plan.  Lymphadenitis - Plan: POCT CBC, Comprehensive metabolic panel  Cough - Plan: DG Chest 2 View  Unspecified hypothyroidism - Plan: TSH, T4, free  Meds ordered this encounter  Medications  . azithromycin (ZITHROMAX) 500 MG tablet    Sig: Take 1 tablet (500 mg total) by mouth daily.    Dispense:  5 tablet    Refill:  0  . HYDROcodone-homatropine (HYCODAN) 5-1.5 MG/5ML syrup    Sig: Take 5 mLs by mouth every 6 (six) hours as needed for cough.    Dispense:  120  mL    Refill:  0   I have completed the patient encounter in its entirety as documented by the scribe, with editing by me where necessary. Robert P. Merla Riches, M.D. Call w/ labs

## 2014-06-17 ENCOUNTER — Emergency Department (HOSPITAL_COMMUNITY): Payer: Self-pay

## 2014-06-17 ENCOUNTER — Encounter (HOSPITAL_COMMUNITY): Payer: Self-pay | Admitting: Emergency Medicine

## 2014-06-17 ENCOUNTER — Emergency Department (HOSPITAL_COMMUNITY)
Admission: EM | Admit: 2014-06-17 | Discharge: 2014-06-17 | Disposition: A | Discharge status: Home / Self Care | Payer: Self-pay | Attending: Emergency Medicine | Admitting: Emergency Medicine

## 2014-06-17 DIAGNOSIS — Z79899 Other long term (current) drug therapy: Secondary | ICD-10-CM | POA: Insufficient documentation

## 2014-06-17 DIAGNOSIS — J069 Acute upper respiratory infection, unspecified: Secondary | ICD-10-CM | POA: Insufficient documentation

## 2014-06-17 DIAGNOSIS — E079 Disorder of thyroid, unspecified: Secondary | ICD-10-CM | POA: Insufficient documentation

## 2014-06-17 DIAGNOSIS — K0889 Other specified disorders of teeth and supporting structures: Secondary | ICD-10-CM

## 2014-06-17 DIAGNOSIS — K089 Disorder of teeth and supporting structures, unspecified: Secondary | ICD-10-CM | POA: Insufficient documentation

## 2014-06-17 DIAGNOSIS — F411 Generalized anxiety disorder: Secondary | ICD-10-CM | POA: Insufficient documentation

## 2014-06-17 DIAGNOSIS — Z792 Long term (current) use of antibiotics: Secondary | ICD-10-CM | POA: Insufficient documentation

## 2014-06-17 MED ORDER — IBUPROFEN 800 MG PO TABS: 800.0000 mg | ORAL_TABLET | Freq: Three times a day (TID) | ORAL | Status: DC

## 2014-06-17 MED ORDER — BENZONATATE 100 MG PO CAPS: 100.0000 mg | ORAL_CAPSULE | Freq: Two times a day (BID) | ORAL | Status: DC | PRN

## 2014-06-17 MED ORDER — DM-GUAIFENESIN ER 30-600 MG PO TB12: 1.0000 | ORAL_TABLET | Freq: Two times a day (BID) | ORAL | Status: DC

## 2014-06-17 MED ORDER — HYDROCODONE-ACETAMINOPHEN 5-325 MG PO TABS: 1.0000 | ORAL_TABLET | ORAL | Status: DC | PRN

## 2014-06-17 MED ORDER — SALINE SPRAY 0.65 % NA SOLN: 1.0000 | NASAL | Status: DC | PRN

## 2014-06-17 MED ORDER — PENICILLIN V POTASSIUM 500 MG PO TABS: 500.0000 mg | ORAL_TABLET | Freq: Four times a day (QID) | ORAL | Status: AC

## 2014-06-17 NOTE — ED Provider Notes (Signed)
CSN: [id number]     Arrival date & time 06/17/14  1333 History  This chart was scribed for non-physician practitioner, Mellody Drown, PA-C working with Glynn Octave, MD by Greggory Stallion, ED scribe. This patient was seen in room WTR9/WTR9 and the patient's care was started at 3:42 PM.   Chief Complaint  Patient presents with  . Cough  . Dental Pain   HPI Comments: Hailey Rowland is a 23 y.o. female who presents to the Emergency Department complaining of continued productive cough of yellow/clear sputum. States she was diagnosed with pneumonia 5-6 months ago and given azithromycin but the cough never went away. Reports intermittent sinus headache, subjective fever, congestion and mild SOB with exertion since she was diagnosed.  The patient reports these symptoms are intermittent and and return for approximately 1-2 weeks at a time resolve and then come back. Denies these symptoms currently. She has taken tylenol, last dose yesterday unknown time. Pt is also complaining of upper dental pain that started one year ago. States she was told several years ago that she needed to get her wisdom teeth taken out but she never did. She states they are now "black and rotted." Pt has used OTC dental covering with some relief. Chills reports over the last year or multiple pieces of her to have fallen out. She has called her dentist and was told that she needed an antibiotic before they could doing anything. And she is unable to afford to see her dentist at this time. PCP: Tonye Pearson, MD   The history is provided by the patient. No language interpreter was used.    Past Medical History  Diagnosis Date  . Anxiety   . Thyroid disease    Past Surgical History  Procedure Laterality Date  . Adenoidectomy    . Myringotomy     No family history on file. History  Substance Use Topics  . Smoking status: Never Smoker   . Smokeless tobacco: Not on file  . Alcohol Use: No   OB History   Grav Para  Term Preterm Abortions TAB SAB Ect Mult Living                 Review of Systems  Constitutional: Positive for fever.  HENT: Positive for congestion, dental problem, rhinorrhea and sinus pressure. Negative for facial swelling.   Respiratory: Positive for cough. Negative for shortness of breath.   Neurological: Positive for headaches.  All other systems reviewed and are negative.  Allergies  Antihistamines, chlorpheniramine-type  Home Medications   Prior to Admission medications   Medication Sig Start Date End Date Taking? Authorizing Provider  ALPRAZolam (XANAX XR) 1 MG 24 hr tablet Take 1 tablet (1 mg total) by mouth daily. At bedtime 11/19/13   Tonye Pearson, MD  azithromycin (ZITHROMAX) 500 MG tablet Take 1 tablet (500 mg total) by mouth daily. 12/07/13   Tonye Pearson, MD  citalopram (CELEXA) 40 MG tablet Take 1 tablet (40 mg total) by mouth daily. 09/30/13   Tonye Pearson, MD  clindamycin-benzoyl peroxide [facility]) gel Apply topically 2 (two) times daily. 06/03/13   Tonye Pearson, MD  HYDROcodone-homatropine [facility]) 5-1.5 MG/5ML syrup Take 5 mLs by mouth every 6 (six) hours as needed for cough. 12/07/13   Tonye Pearson, MD  levothyroxine (SYNTHROID, LEVOTHROID) 200 MCG tablet Take 1 tablet (200 mcg total) by mouth daily. 09/07/13   Eleanore Delia Chimes, PA-C  montelukast (SINGULAIR) 10 MG tablet Take 1 tablet (10  mg total) by mouth at bedtime. 11/19/13   Tonye Pearsonobert P Doolittle, MD  traZODone (DESYREL) 100 MG tablet TAKE ONE TABLET BY MOUTH EVERY DAY AT BEDTIME 09/30/13   Tonye Pearsonobert P Doolittle, MD  valACYclovir (VALTREX) 1000 MG tablet Take 1 tablet (1,000 mg total) by mouth 2 (two) times daily. Take at first onset of symptoms 09/30/13   Tonye Pearsonobert P Doolittle, MD   BP 128/86  Pulse 85  Temp(Src) 98 F (36.7 C) (Oral)  Resp 16  SpO2 100%  LMP 06/15/2014  Physical Exam  Nursing note and vitals reviewed. Constitutional: She is oriented to person, place, and time. She  appears well-developed and well-nourished.  Non-toxic appearance. She does not have a sickly appearance. She does not appear ill. No distress.  HENT:  Head: Normocephalic and atraumatic.  Right Ear: Tympanic membrane normal. Tympanic membrane is not erythematous and not bulging.  Left Ear: Tympanic membrane normal. Tympanic membrane is not erythematous and not bulging.  Nose: Rhinorrhea present.  Mouth/Throat: Uvula is midline, oropharynx is clear and moist and mucous membranes are normal. No trismus in the jaw. No oropharyngeal exudate, posterior oropharyngeal edema, posterior oropharyngeal erythema or tonsillar abscesses.  Tooth # 15 with large cavity and jagged edges. Multiple dental caries.  No signs of peritonsillar or tonsillar abscess. No signs of gingival abscess. Oropharynx is clear and without exudates. Soft non-tender sublingual mucosa, no tongue elevation, no edema to sublingual space, normal voice. Airway patent.   Eyes: Conjunctivae and EOM are normal. Pupils are equal, round, and reactive to light.  Neck: Neck supple. Thyromegaly present.  Cardiovascular: Normal rate.   Pulmonary/Chest: Effort normal. No respiratory distress.  Musculoskeletal: Normal range of motion.  Lymphadenopathy:       Head (right side): No submental, no submandibular, no tonsillar and no occipital adenopathy present.       Head (left side): No submental, no submandibular, no tonsillar and no occipital adenopathy present.    She has no cervical adenopathy.  Neurological: She is alert and oriented to person, place, and time.  Skin: Skin is warm and dry. She is not diaphoretic.  Psychiatric: She has a normal mood and affect. Her behavior is normal.    ED Course  Procedures (including critical care time)  DIAGNOSTIC STUDIES: Oxygen Saturation is 100% on RA, normal by my interpretation.    COORDINATION OF CARE: 3:49 PM-Discussed treatment plan which includes penicillin, pain medication, nasal spray and  tessalon perles with pt at bedside and pt agreed to plan. Advised pt to follow up with her dentist.   Labs Review Labs Reviewed - No data to display  Imaging Review Dg Chest 2 View  06/17/2014   CLINICAL DATA:  Right-sided chest pain. Cough and congestion and shortness of breath.  EXAM: CHEST  2 VIEW  COMPARISON:  12/07/2013  FINDINGS: The heart size and mediastinal contours are within normal limits. Both lungs are clear. The visualized skeletal structures are unremarkable.  IMPRESSION: Normal exam.   Electronically Signed   By: Geanie CooleyJim  Maxwell M.D.   On: 06/17/2014 14:24     EKG Interpretation None      MDM   Final diagnoses:  URI (upper respiratory infection)  Pain, dental   Patient with toothache.  No gross abscess.  Exam unconcerning for Ludwig's angina or spread of infection.  Will treat with penicillin and pain medicine.  Urged patient to follow-up with dentist.   Pt CXR negative for acute infiltrate, afebrile in the ED. Patients symptoms are consistent with  URI, likely viral etiology. Discussed that antibiotics are not indicated for viral infections. Pt will be discharged with symptomatic treatment.  Verbalizes understanding and is agreeable with plan. Pt is hemodynamically stable & in NAD prior to dc.  Meds given in ED:  Medications - No data to display  New Prescriptions   BENZONATATE (TESSALON) 100 MG CAPSULE    Take 1 capsule (100 mg total) by mouth 2 (two) times daily as needed for cough.   DEXTROMETHORPHAN-GUAIFENESIN (MUCINEX DM) 30-600 MG PER 12 HR TABLET    Take 1 tablet by mouth 2 (two) times daily.   HYDROCODONE-ACETAMINOPHEN (NORCO/VICODIN) 5-325 MG PER TABLET    Take 1 tablet by mouth every 4 (four) hours as needed for moderate pain or severe pain.   IBUPROFEN (ADVIL,MOTRIN) 800 MG TABLET    Take 1 tablet (800 mg total) by mouth 3 (three) times daily with meals.   PENICILLIN V POTASSIUM (VEETID) 500 MG TABLET    Take 1 tablet (500 mg total) by mouth 4 (four) times  daily.   SODIUM CHLORIDE (OCEAN) 0.65 % SOLN NASAL SPRAY    Place 1 spray into both nostrils as needed for congestion.      I personally performed the services described in this documentation, which was scribed in my presence. The recorded information has been reviewed and is accurate.  Clabe SealLauren M Parker, PA-C 06/17/14 [id number]

## 2014-06-17 NOTE — ED Notes (Addendum)
Pt states that she has "had pneumonia for 6 months".  States she took amoxicillin but still has a cough.  States that she has had a productive cough with yellow/clear mucous.  Pt speaking in complete sentences.  NAD at this time.  Pt also wants to be looked at for her wisdom teeth she was supposed to have taken out several years ago.  States she started having pain over a year ago and now the tooth is "black and rotted"

## 2014-06-17 NOTE — ED Provider Notes (Signed)
Medical screening examination/treatment/procedure(s) were performed by non-physician practitioner and as supervising physician I was immediately available for consultation/collaboration.   EKG Interpretation None        Gwyneth SproutWhitney Plunkett, MD 06/17/14 1811

## 2014-06-17 NOTE — ED Notes (Signed)
Pt ambulatory with steady gait to BR.

## 2014-06-17 NOTE — Discharge Instructions (Signed)
Call for a follow up appointment with a Family or Primary Care Provider.  Call a dentist for further evaluation of your dental pain. Return if Symptoms worsen.   Take medication as prescribed.   You have a dental injury. Use the resource guide listed below to help you find a dentist if you do not already have one to followup with. It is very important that you get evaluated by a dentist as soon as possible. Call tomorrow to schedule an appointment. Use your pain medication as prescribed and do not operate heavy machinery while on pain medication. Note that your pain medication contains acetaminophen (Tylenol) & its is not reccommended that you use additional acetaminophen (Tylenol) while taking this medication. Take your full course of antibiotics. Read the instructions below.  Eat a soft or liquid diet and rinse your mouth out after meals with warm water. You should see a dentist or return here at once if you have increased swelling, increased pain or uncontrolled bleeding from the site of your injury.   SEEK MEDICAL CARE IF:   You have increased pain not controlled with medicines.   You have swelling around your tooth, in your face or neck.   You have bleeding which starts, continues, or gets worse.   You have a fever >101  If you are unable to open your mouth  RESOURCE GUIDE  Dental Problems  Patients with Medicaid: [facility]                     [facility] [phone]                                           [phone] Phone:  [phone]                                                  Phone:  [phone]  If unable to pay or uninsured, contact:  [facility] or [facility] to become qualified for the adult dental clinic.  Chronic Pain Problems Contact [facility]  [phone] Patients need to be referred by their primary care doctor.  Insufficient Money for Medicine Contact [employer] Way:  call "211" or  [facility] [phone].  No Primary Care Doctor Call [facility]  [phone] Other agencies that provide inexpensive medical care    [facility]  [phone]    [facility]  [phone]    [facility]  [phone]    [facility]  [phone]    [facility]  [phone]    [facility]  [phone]  Psychological Services [facility]  [phone] [facility]  [phone] [facility]   [phone] (emergency services [phone])  Substance Abuse Resources [facility]  [phone] [facility] [phone] [facility] [phone] [facility] [phone] Residential & Outpatient Substance Abuse Program  [phone]  Abuse/Neglect [facility] Hotline [phone](336) [phone] [facility] Hotline [phone] (After Hours)  Emergency Shelter [facility] [phone](336) [phone]  Maternity Homes [facility] at the [facility] [phone](336) [phone] [facility] [phone](704) [phone]  MRSA Hotline #:  130-8657(828)476-8288    [facility] Resources  [facility]     [employer]                          [facility] [address] [place]                       [address]      [address]  [place]                                                [place]                            [place] Phone:  [phone]                                   Phone:  [phone]                 Phone:  [phone]  [facility] Phone:  [phone]  [facility] Hotline [phone](336) 618-430-2689 [phone]) [phone] (After Hours)

## 2014-07-06 ENCOUNTER — Other Ambulatory Visit: Payer: Self-pay | Admitting: Internal Medicine

## 2014-07-28 ENCOUNTER — Other Ambulatory Visit: Payer: Self-pay | Admitting: *Deleted

## 2014-07-28 MED ORDER — TRAZODONE HCL 100 MG PO TABS: ORAL_TABLET | ORAL | Status: DC

## 2014-08-03 ENCOUNTER — Ambulatory Visit (INDEPENDENT_AMBULATORY_CARE_PROVIDER_SITE_OTHER): Payer: Self-pay | Admitting: Internal Medicine

## 2014-08-03 VITALS — BP 98/64 | HR 64 | Temp 98.4°F | Resp 16 | Ht 63.5 in | Wt 170.6 lb

## 2014-08-03 DIAGNOSIS — E039 Hypothyroidism, unspecified: Secondary | ICD-10-CM

## 2014-08-03 DIAGNOSIS — J309 Allergic rhinitis, unspecified: Secondary | ICD-10-CM

## 2014-08-03 DIAGNOSIS — G4700 Insomnia, unspecified: Secondary | ICD-10-CM

## 2014-08-03 DIAGNOSIS — K029 Dental caries, unspecified: Secondary | ICD-10-CM

## 2014-08-03 DIAGNOSIS — F341 Dysthymic disorder: Secondary | ICD-10-CM

## 2014-08-03 DIAGNOSIS — R51 Headache: Secondary | ICD-10-CM

## 2014-08-03 DIAGNOSIS — F411 Generalized anxiety disorder: Secondary | ICD-10-CM

## 2014-08-03 DIAGNOSIS — N920 Excessive and frequent menstruation with regular cycle: Secondary | ICD-10-CM

## 2014-08-03 DIAGNOSIS — N921 Excessive and frequent menstruation with irregular cycle: Secondary | ICD-10-CM

## 2014-08-03 MED ORDER — PREDNISONE 20 MG PO TABS: ORAL_TABLET | ORAL | Status: DC

## 2014-08-03 MED ORDER — LEVOTHYROXINE SODIUM 200 MCG PO TABS: 200.0000 ug | ORAL_TABLET | Freq: Every day | ORAL | Status: DC

## 2014-08-03 MED ORDER — LIDOCAINE VISCOUS 2 % MT SOLN: OROMUCOSAL | Status: DC

## 2014-08-03 MED ORDER — AMOXICILLIN 500 MG PO CAPS: 1000.0000 mg | ORAL_CAPSULE | Freq: Two times a day (BID) | ORAL | Status: AC

## 2014-08-03 MED ORDER — CLONAZEPAM 1 MG PO TABS: 1.0000 mg | ORAL_TABLET | Freq: Every day | ORAL | Status: DC

## 2014-08-03 MED ORDER — CITALOPRAM HYDROBROMIDE 40 MG PO TABS: 40.0000 mg | ORAL_TABLET | Freq: Every day | ORAL | Status: DC

## 2014-08-03 MED ORDER — MONTELUKAST SODIUM 10 MG PO TABS: 10.0000 mg | ORAL_TABLET | Freq: Every day | ORAL | Status: DC

## 2014-08-03 MED ORDER — OXYCODONE-ACETAMINOPHEN 10-325 MG PO TABS: 0.5000 | ORAL_TABLET | Freq: Three times a day (TID) | ORAL | Status: DC | PRN

## 2014-08-03 MED ORDER — NORGESTIMATE-ETH ESTRADIOL 0.25-35 MG-MCG PO TABS: 1.0000 | ORAL_TABLET | Freq: Every day | ORAL | Status: DC

## 2014-08-03 MED ORDER — TRAZODONE HCL 100 MG PO TABS: ORAL_TABLET | ORAL | Status: DC

## 2014-08-03 NOTE — Progress Notes (Signed)
Subjective:    Patient ID: Hailey Rowland, female    DOB: 04-23-1991, 23 y.o.   MRN: [id number] This chart was scribed for Ellamae Siaobert Lavayah Vita, MD by Gwenevere AbbotAlexis Brown, ED scribe. This patient was seen in room Room/bed 11 and the patient's care was started at 6:50 PM.   HPI Chief Complaint  Patient presents with  . Medication Refill    all meds, talk about iron  . Dental Pain    top left x >yr  . Menorrhagia    daily heavy spotting since stopped Depo shot x 4 mos  . Headache    d/t allergies, singulair not effective any longer  . Abdominal Pain    chronic worse after eating, usually diarrhea or constipation, loss of appetite, vomiting x 4- 5 yrs, suspects crohn's  . Loss of Consciousness    4 times last 6 mos, thinks its due to dehydration, when stands suddenly   HPI Comments:  Hailey Rowland is a 23 y.o. female who [facility] with concerns of headache, dental pain,  menorrhagia, and abdominal pain. Pt would also like to seek a medication refill and adjustment.  Headache and Refill 1. Pt states that her current allergy medication is no longer affective, and that she is experiencing headaches, and nose bleeds. Pt states that her headaches are extremely severe, with associated symptoms of neck, shoulder, and back stiffness. Pt states that the pain is debilitating. Pt states that she has not been sleeping well and that she awakes multiple times during the night, and that it takes her a long time to fall asleep. No associated migraine symptoms. No blurred vision or dizziness. Problems falling asleep more related to anxiety as in the past. 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 The family is destitute and has no insurance and she is exceedingly concerned about money all the time.  Dental Pain 2. Pt states that she is also having dental pain, as a result of infection and an exposed nerve. Cannot afford a dentist.  Menorrhagia 3. Pt states  that she received the depo shot two times, and has been off of the shot for the past four months, but she is still experiencing bleeding constantly. Pt states that she experiences stabbing pelvic pain twice a month. She is not sexually active and has not been since her last STD screening. She has no excessive bruising  4. anxiety with depression-she continues to respond fairly well to Celexa  Review of Systems  HENT: Positive for dental problem.   Gastrointestinal: Positive for abdominal pain.  Genitourinary: Positive for vaginal bleeding and menstrual problem.  Musculoskeletal: Positive for arthralgias, back pain, myalgias, neck pain and neck stiffness.  Neurological: Positive for headaches.   no wheezing No abnormal weight changes     Objective:   Physical Exam  Nursing note and vitals reviewed. Constitutional: She is oriented to person, place, and time. She appears well-developed and well-nourished.  HENT:  Head: Normocephalic and atraumatic.  Right Ear: Tympanic membrane and external ear normal.  Left Ear: Tympanic membrane and external ear normal.  Mouth/Throat: Oropharynx is clear and moist. Abnormal dentition. Dental abscesses present.  Nares looked allergic with discharge.  Eyes: EOM are normal.  Neck: Normal range of motion. Neck supple.  Cardiovascular: Normal rate.   Pulmonary/Chest: Effort normal.  Musculoskeletal: Normal range of motion.  Neurological: She is alert and oriented to person, place, and time.  Skin: Skin is warm and  dry.  Psychiatric: She has a normal mood and affect. Her behavior is normal.   Wt Readings from Last 3 Encounters:  08/03/14 170 lb 9.6 oz (77.384 kg)  12/07/13 170 lb (77.111 kg)  11/19/13 171 lb (77.565 kg)  BP 98/64  Pulse 64  Temp(Src) 98.4 F (36.9 C) (Oral)  Resp 16  Ht 5' 3.5" (1.613 m)  Wt 170 lb 9.6 oz (77.384 kg)  BMI 29.74 kg/m2  SpO2 98%  LMP 07/27/2014         Assessment & Plan:  I have completed the patient  encounter in its entirety as documented by the scribe, with editing by me where necessary. Geramy Lamorte P. Merla Riches, M.D. Dysthymic disorder -/GAD (generalized anxiety disorder)/Insomnia  Continue Celexa//continue trazodone 100 mg-when she increases to 150 caused her to be angry and had more headaches  Klonopin at bedtime added  Unspecified hypothyroidism  Continue replacement//retest when she can afford it  Allergic rhinitis--headaches exacerbated by this and possibly secondary   Intensify her treatment  Dental caries with abscess--- needs treatment///referred to the dental clinic at [facility]//started amoxicillin  Menorrhagia with irregular cycle  Start Sprintec  Meds ordered this encounter  Medications  . predniSONE (DELTASONE) 20 MG tablet    Sig: 3/3/2/2/1/1 single daily dose for 6 days    Dispense:  12 tablet    Refill:  0  . amoxicillin (AMOXIL) 500 MG capsule    Sig: Take 2 capsules (1,000 mg total) by mouth 2 (two) times daily.    Dispense:  40 capsule    Refill:  0  . lidocaine (XYLOCAINE) 2 % solution    Sig: Coat tooth q 2 h as needed    Dispense:  60 mL    Refill:  5  . oxyCODONE-acetaminophen (PERCOCET) 10-325 MG per tablet    Sig: Take 0.5-1 tablets by mouth every 8 (eight) hours as needed for pain.    Dispense:  12 tablet    Refill:  0  . norgestimate-ethinyl estradiol (ORTHO-CYCLEN,SPRINTEC,PREVIFEM) 0.25-35 MG-MCG tablet    Sig: Take 1 tablet by mouth daily.    Dispense:  1 Package    Refill:  11  . clonazePAM (KLONOPIN) 1 MG tablet    Sig: Take 1 tablet (1 mg total) by mouth at bedtime.    Dispense:  30 tablet    Refill:  5  . levothyroxine (SYNTHROID, LEVOTHROID) 200 MCG tablet    Sig: Take 1 tablet (200 mcg total) by mouth daily.    Dispense:  90 tablet    Refill:  3  . traZODone (DESYREL) 100 MG tablet    Sig: TAKE ONE TABLET BY MOUTH EVERY DAY AT BEDTIME - NO MORE REFILLS WITHOUT OFFICE VISIT.    Dispense:  30 tablet    Refill:  11  . citalopram  (CELEXA) 40 MG tablet    Sig: Take 1 tablet (40 mg total) by mouth daily.    Dispense:  90 tablet    Refill:  3  . montelukast (SINGULAIR) 10 MG tablet    Sig: Take 1 tablet (10 mg total) by mouth at bedtime.    Dispense:  30 tablet    Refill:  11   [facility]

## 2014-08-20 ENCOUNTER — Telehealth: Payer: Self-pay

## 2014-08-20 NOTE — Telephone Encounter (Signed)
Dr Merla Riches,  Patient does not have the funds to go to the dentist for her rotting tooth.  She is requesting more oxycodone for the pain.   [phone]

## 2014-08-20 NOTE — Telephone Encounter (Signed)
Risk Of infection is too great from rotting tooth to allow her to continue just take pain medication and avoid having it fixed---she can go to the free clinic at [facility] Step 1: Application To receive an application, click here to enter your name, address and language preference. Alternatively, potential patients can call [phone] to have an application mailed to their residence within 10 days. The application must be entirely completed and returned to the address on the application. Original applications only will be accepted (no photocopies   She can use tramadol at hs to sleep and put lidocaine 5% gel on it frequently during the day until then

## 2014-08-21 NOTE — Telephone Encounter (Signed)
Lm for rtn call 

## 2014-08-22 ENCOUNTER — Encounter: Payer: Self-pay | Admitting: Internal Medicine

## 2014-08-22 MED ORDER — TRAMADOL HCL 50 MG PO TABS: 50.0000 mg | ORAL_TABLET | Freq: Three times a day (TID) | ORAL | Status: DC | PRN

## 2014-08-22 NOTE — Telephone Encounter (Signed)
Faxed

## 2014-08-22 NOTE — Telephone Encounter (Signed)
Pt notified. Can we rx Tramadol for her? She doesn't have any. Info given to pt for [facility]

## 2014-08-23 ENCOUNTER — Encounter: Payer: Self-pay | Admitting: Internal Medicine

## 2014-08-24 ENCOUNTER — Telehealth: Payer: Self-pay | Admitting: Internal Medicine

## 2014-08-24 DIAGNOSIS — G4700 Insomnia, unspecified: Secondary | ICD-10-CM

## 2014-08-24 DIAGNOSIS — F411 Generalized anxiety disorder: Secondary | ICD-10-CM

## 2014-08-24 NOTE — Telephone Encounter (Signed)
The patient called to request refill of xanax.  The patient is experiencing a lot of anxiety and feels this medication would help her symptoms.  The patient requests return call to [phone].

## 2014-08-26 MED ORDER — DICYCLOMINE HCL 20 MG PO TABS: 20.0000 mg | ORAL_TABLET | Freq: Three times a day (TID) | ORAL | Status: DC

## 2014-08-26 MED ORDER — ALPRAZOLAM ER 1 MG PO TB24: 1.0000 mg | ORAL_TABLET | Freq: Every day | ORAL | Status: DC

## 2014-08-26 NOTE — Telephone Encounter (Signed)
Meds ordered this encounter  Medications  . ALPRAZolam (XANAX XR) 1 MG 24 hr tablet    Sig: Take 1 tablet (1 mg total) by mouth daily. At bedtime    Dispense:  30 tablet    Refill:  0

## 2014-08-26 NOTE — Telephone Encounter (Signed)
Called in and notified pt

## 2014-09-23 ENCOUNTER — Encounter: Payer: Self-pay | Admitting: Internal Medicine

## 2014-10-16 ENCOUNTER — Other Ambulatory Visit: Payer: Self-pay | Admitting: Internal Medicine

## 2014-10-16 DIAGNOSIS — G4700 Insomnia, unspecified: Secondary | ICD-10-CM

## 2014-10-16 DIAGNOSIS — F411 Generalized anxiety disorder: Secondary | ICD-10-CM

## 2014-10-17 ENCOUNTER — Encounter: Payer: Self-pay | Admitting: Internal Medicine

## 2014-10-17 MED ORDER — ALPRAZOLAM ER 1 MG PO TB24: 1.0000 mg | ORAL_TABLET | Freq: Every day | ORAL | Status: DC

## 2014-10-17 NOTE — Telephone Encounter (Signed)
Pt called regarding xanex refill. Asks for 1mg . Doesn't have any refills left.  Please call pt   Pharmacy: [facility] [address]  bf

## 2014-11-11 ENCOUNTER — Telehealth: Payer: Self-pay | Admitting: Internal Medicine

## 2014-11-11 ENCOUNTER — Other Ambulatory Visit: Payer: Self-pay | Admitting: Internal Medicine

## 2014-11-11 ENCOUNTER — Encounter: Payer: Self-pay | Admitting: Internal Medicine

## 2014-11-11 MED ORDER — HYDROCODONE-ACETAMINOPHEN 5-325 MG PO TABS: 1.0000 | ORAL_TABLET | Freq: Four times a day (QID) | ORAL | Status: DC | PRN

## 2014-11-11 NOTE — Progress Notes (Unsigned)
Dental pain see mychart Meds ordered this encounter  Medications  . HYDROcodone-acetaminophen (NORCO/VICODIN) 5-325 MG per tablet    Sig: Take 1 tablet by mouth every 6 (six) hours as needed for moderate pain.    Dispense:  30 tablet    Refill:  0

## 2014-11-11 NOTE — Telephone Encounter (Signed)
LM for pt give Dr. Merla Richesoolittle a chance to respond to email.

## 2014-11-11 NOTE — Telephone Encounter (Signed)
Patient was treated for a toothache. States that the Tramadol is not helping the pain. Patient is requesting something stronger.  [phone]

## 2014-11-12 NOTE — Telephone Encounter (Signed)
Forward to PA to sign--this is ok My next workday is sunday

## 2014-11-13 NOTE — Telephone Encounter (Signed)
Faxed

## 2014-11-13 NOTE — Telephone Encounter (Signed)
Done

## 2014-11-17 ENCOUNTER — Other Ambulatory Visit: Payer: Self-pay | Admitting: Internal Medicine

## 2014-11-17 MED ORDER — HYDROCODONE-ACETAMINOPHEN 5-325 MG PO TABS: 1.0000 | ORAL_TABLET | Freq: Four times a day (QID) | ORAL | Status: DC | PRN

## 2014-11-18 ENCOUNTER — Encounter: Payer: Self-pay | Admitting: Internal Medicine

## 2014-11-30 ENCOUNTER — Ambulatory Visit (INDEPENDENT_AMBULATORY_CARE_PROVIDER_SITE_OTHER): Payer: Self-pay | Admitting: Internal Medicine

## 2014-11-30 VITALS — BP 124/60 | HR 72 | Temp 97.5°F | Resp 16 | Ht 64.0 in | Wt 176.2 lb

## 2014-11-30 DIAGNOSIS — F411 Generalized anxiety disorder: Secondary | ICD-10-CM

## 2014-11-30 DIAGNOSIS — R112 Nausea with vomiting, unspecified: Secondary | ICD-10-CM

## 2014-11-30 DIAGNOSIS — K589 Irritable bowel syndrome without diarrhea: Secondary | ICD-10-CM

## 2014-11-30 MED ORDER — CLARITHROMYCIN 500 MG PO TABS: 500.0000 mg | ORAL_TABLET | Freq: Two times a day (BID) | ORAL | Status: DC

## 2014-11-30 MED ORDER — CLONAZEPAM 1 MG PO TABS: 1.0000 mg | ORAL_TABLET | Freq: Every day | ORAL | Status: DC

## 2014-11-30 MED ORDER — ALPRAZOLAM 1 MG PO TABS: ORAL_TABLET | ORAL | Status: DC

## 2014-11-30 MED ORDER — AMOXICILLIN 500 MG PO CAPS: 1000.0000 mg | ORAL_CAPSULE | Freq: Two times a day (BID) | ORAL | Status: AC

## 2014-11-30 MED ORDER — PROMETHAZINE HCL 25 MG PO TABS: 25.0000 mg | ORAL_TABLET | Freq: Three times a day (TID) | ORAL | Status: DC | PRN

## 2014-11-30 MED ORDER — RANITIDINE HCL 300 MG PO TABS: 300.0000 mg | ORAL_TABLET | Freq: Every day | ORAL | Status: DC

## 2014-11-30 NOTE — Progress Notes (Signed)
Subjective:    Patient ID: Hailey Rowland, female    DOB: 30-Nov-1991, 23 y.o.   MRN: [id number]  HPI here with mother complaining of progressive gastrointestinal problems Throws up whenever she eats--lots of burning in epigastr area Diarrhea--3 times a day then const for few days Dicyclomine for IBS, which has been present for several years, made blurry vision and so she discontinued this 3 or 4 months ago Mom tests sugars and always good She has a history of positive H. pylori and was treated several years ago. To her the symptoms feel the same.  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 She is asking for pain medicine to continue as she can't afford to have it fixed.  Patient Active Problem List   Diagnosis Date Noted  . Unspecified hypothyroidism 06/05/2013    Priority: Medium  . 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 Her father lost his job about a year ago, is unemployed and the whole family is uninsured. Her mother has debilitating diabetes with numerous complications and cannot afford her medicines and so is not doing well. Although Hailey Rowland was an A student in middle school and high school she's been unable to manage to attend further education in a way that qualifies her for a job that might help the family survive. 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 The family car is also no longer working.  Review of Systems No headaches or vision  changes No chest pain or palpitations No shortness of breath No hematochezia No menstrual problems/not sexually active No dysuria or frequency No polyuria or polyphagia     Objective:   Physical Exam Wt Readings from Last 3 Encounters:  11/30/14 176 lb 3.2 oz (79.924 kg)  08/03/14 170 lb 9.6 oz (77.384 kg)  12/07/13 170 lb (77.111 kg)   she remains overweight despite her insistence that she throws up so much that she gets few calories HEENT clear including no thyromegaly or lymphadenopathy Heart regular without murmur Lungs are clear The abdomen is soft nontender nondistended with no organomegaly or masses Extremities have no edema Full peripheral pulses Neurological intact Mood is somewhat anxious/affect is very likely appropriate/thought content is okay but judgment is not completely appropriate        Assessment & Plan:  Problem #1 nausea with vomiting chronic Problem #2 dyspepsia/heartburn Problem #3 irritable bowel syndrome with intermittent diarrhea and constipation  She is unable to afford endoscopy or referral to GI  Unable to afford most medicines We will started low cost alternative to prev- pac Problem #4 overweight Problem #5 general anxiety disorder--meds continued Problem #6 dental caries with significant pain--- stress to her that it would not be allowable to continue pain medications for this dental pain and that her health was threatened by not having this cared for/[facility] and the dental services through [facility] Problem #7 hypothyroidism with last TSH one year  ago//she cannot afford retesting this point  She is referred to the [facility] for further healthcare

## 2014-11-30 NOTE — Patient Instructions (Signed)
peptobismol tablets 2 4 times a day for 1 month

## 2014-12-09 ENCOUNTER — Other Ambulatory Visit: Payer: Self-pay | Admitting: Physician Assistant

## 2015-03-02 ENCOUNTER — Ambulatory Visit (INDEPENDENT_AMBULATORY_CARE_PROVIDER_SITE_OTHER): Payer: 59 | Admitting: Family Medicine

## 2015-03-02 VITALS — BP 100/80 | HR 81 | Temp 97.6°F | Resp 12 | Ht 64.5 in | Wt 176.5 lb

## 2015-03-02 DIAGNOSIS — G43009 Migraine without aura, not intractable, without status migrainosus: Secondary | ICD-10-CM

## 2015-03-02 DIAGNOSIS — J302 Other seasonal allergic rhinitis: Secondary | ICD-10-CM | POA: Diagnosis not present

## 2015-03-02 MED ORDER — METHYLPREDNISOLONE 4 MG PO TABS: 4.0000 mg | ORAL_TABLET | Freq: Two times a day (BID) | ORAL | Status: DC

## 2015-03-02 MED ORDER — BUTALBITAL-APAP-CAFFEINE 50-325-40 MG PO TABS: 1.0000 | ORAL_TABLET | Freq: Four times a day (QID) | ORAL | Status: DC | PRN

## 2015-03-02 NOTE — Progress Notes (Signed)
This chart was scribed for Dr. Elvina Sidle, MD by Jarvis Morgan, Medical Scribe. This patient was seen in Room 14 and the patient's care was started at 8:43 PM.   Patient ID: GESELLE CARDOSA MRN: [id number], DOB: 29-Aug-1991, 24 y.o. Date of Encounter: 03/02/2015, 8:51 PM  Primary Physician: Tonye Pearson, MD  Chief Complaint:  Chief Complaint  Patient presents with   Migraine    since Friday   Emesis   Passed Out    Sunday    HPI: 24 y.o. year old female with history below presents with constant, moderate migraine for 4 days. She states the pain radiates down her neck. She has a h/o migraines and triggered by her allergies. Pt is having associated nausea, emesis, dizziness, and near syncope episodes. She is also having photophobia and misphonia.  She states she gets episodes of lightheadedness when standing up too quickly. Pt previously took Singular for her allergies but she states they were no longer working. She has tried [employer] and Claritin with no relief. She denies any tinnitus, otalgia or any other complaints.   Past Medical History  Diagnosis Date   Anxiety    Thyroid disease      Home Meds: Prior to Admission medications   Medication Sig Start Date End Date Taking? Authorizing Provider  ALPRAZolam Prudy Feeler) 1 MG tablet Prn once a day for anxiety 11/30/14  Yes Tonye Pearson, MD  citalopram (CELEXA) 40 MG tablet Take 1 tablet (40 mg total) by mouth daily. 08/03/14  Yes Tonye Pearson, MD  clindamycin-benzoyl peroxide [facility]) gel Apply topically 2 (two) times daily. 06/03/13  Yes Tonye Pearson, MD  clonazePAM (KLONOPIN) 1 MG tablet Take 1 tablet (1 mg total) by mouth at bedtime. 11/30/14  Yes Tonye Pearson, MD  ibuprofen (ADVIL,MOTRIN) 800 MG tablet Take 1 tablet (800 mg total) by mouth 3 (three) times daily with meals. 06/17/14  Yes Mellody Drown, PA-C  levothyroxine (SYNTHROID, LEVOTHROID) 200 MCG tablet Take 1 tablet (200 mcg total) by  mouth daily. 08/03/14  Yes Tonye Pearson, MD  montelukast (SINGULAIR) 10 MG tablet Take 1 tablet (10 mg total) by mouth at bedtime. 08/03/14  Yes Tonye Pearson, MD  promethazine (PHENERGAN) 25 MG tablet Take 1 tablet (25 mg total) by mouth every 8 (eight) hours as needed for nausea or vomiting. 11/30/14  Yes Tonye Pearson, MD  ranitidine (ZANTAC) 300 MG tablet Take 1 tablet (300 mg total) by mouth at bedtime. 11/30/14  Yes Tonye Pearson, MD  traZODone (DESYREL) 100 MG tablet TAKE ONE TABLET BY MOUTH EVERY DAY AT BEDTIME - NO MORE REFILLS WITHOUT OFFICE VISIT. 08/03/14  Yes Tonye Pearson, MD  valACYclovir (VALTREX) 1000 MG tablet Take 1 tablet (1,000 mg total) by mouth 2 (two) times daily. Take at first onset of symptoms 09/30/13  Yes Tonye Pearson, MD    Allergies:  Allergies  Allergen Reactions   Antihistamines, Chlorpheniramine-Type     History   Social History   Marital Status: Single    Spouse Name: N/A   Number of Children: N/A   Years of Education: N/A   Occupational History   Not on file.   Social History Main Topics   Smoking status: Never Smoker    Smokeless tobacco: Never Used   Alcohol Use: No   Drug Use: No   Sexual Activity: Yes   Other Topics Concern   Not on file   Social History Narrative  Marital: single; dating steady boyfriend x 4 months      Children: none      Lives: with mom and dad      Employment:  Unemployed; graduated from high school in 2012      Tobacco     Review of Systems: Constitutional: negative for chills, fever, night sweats, weight changes, or fatigue  HEENT: positive for photophobia. negative for vision changes, otalgia, tinnitus, hearing loss, congestion, rhinorrhea, ST, epistaxis, or sinus pressure Cardiovascular: negative for chest pain or palpitations Respiratory: negative for hemoptysis, wheezing, shortness of breath, or cough Abdominal: positive for nausea and emesis. negative for abdominal  pain, diarrhea, or constipation Dermatological: negative for rash Neurologic: positive for headache, dizziness, lightheadedness and near syncope. All other systems reviewed and are otherwise negative with the exception to those above and in the HPI.   Physical Exam: Blood pressure 100/80, pulse 81, temperature 97.6 F (36.4 C), temperature source Oral, resp. rate 12, height 5' 4.5" (1.638 m), weight 176 lb 8 oz (80.06 kg), SpO2 100 %., Body mass index is 29.84 kg/(m^2). General: Well developed, well nourished, in no acute distress. Head: Normocephalic, atraumatic, eyes without discharge, sclera non-icteric, nares are without discharge. Bilateral auditory canals clear, TM's are without perforation, pearly grey and translucent with reflective cone of light bilaterally. Oral cavity moist, posterior pharynx without exudate, erythema, peritonsillar abscess, or post nasal drip.  Neck: Supple. No thyromegaly. Full ROM. No lymphadenopathy. Lungs: Clear bilaterally to auscultation without wheezes, rales, or rhonchi. Breathing is unlabored. Heart: RRR with S1 S2. No murmurs, rubs, or gallops appreciated. Abdomen: Soft, non-tender, non-distended with normoactive bowel sounds. No hepatomegaly. No rebound/guarding. No obvious abdominal masses. Msk:  Strength and tone normal for age. Extremities/Skin: Warm and dry. No clubbing or cyanosis. No edema. No rashes or suspicious lesions. Neuro: Alert and oriented X 3. Moves all extremities spontaneously. Gait is normal. CNII-XII grossly in tact. Psych:  Responds to questions appropriately with a normal affect.   Labs:   ASSESSMENT AND PLAN:  24 y.o. year old female with   1. Nonintractable migraine, unspecified migraine type   2. Other seasonal allergic rhinitis     Meds ordered this encounter  Medications   butalbital-acetaminophen-caffeine (FIORICET) 50-325-40 MG per tablet    Sig: Take 1-2 tablets by mouth every 6 (six) hours as needed for headache.     Dispense:  20 tablet    Refill:  0   methylPREDNISolone (MEDROL) 4 MG tablet    Sig: Take 1 tablet (4 mg total) by mouth 2 (two) times daily.    Dispense:  6 tablet    Refill:  0    This chart was scribed in my presence and reviewed by me personally.    ICD-9-CM ICD-10-CM   1. Nonintractable migraine, unspecified migraine type 346.10 G43.009 butalbital-acetaminophen-caffeine (FIORICET) 50-325-40 MG per tablet  2. Other seasonal allergic rhinitis 477.8 J30.2 methylPREDNISolone (MEDROL) 4 MG tablet     Signed, Elvina SidleKurt Lauenstein, MD   Signed, Elvina SidleKurt Lauenstein, MD 03/02/2015 8:51 PM

## 2015-03-02 NOTE — Patient Instructions (Signed)

## 2015-03-03 ENCOUNTER — Ambulatory Visit: Payer: 59 | Admitting: Internal Medicine

## 2015-03-04 ENCOUNTER — Encounter: Payer: Self-pay | Admitting: Internal Medicine

## 2015-03-05 ENCOUNTER — Ambulatory Visit (INDEPENDENT_AMBULATORY_CARE_PROVIDER_SITE_OTHER): Payer: 59 | Admitting: Physician Assistant

## 2015-03-05 ENCOUNTER — Encounter (HOSPITAL_COMMUNITY): Payer: Self-pay | Admitting: Emergency Medicine

## 2015-03-05 ENCOUNTER — Emergency Department (HOSPITAL_COMMUNITY)
Admission: EM | Admit: 2015-03-05 | Discharge: 2015-03-06 | Disposition: A | Discharge status: Home / Self Care | Payer: Self-pay | Attending: Emergency Medicine | Admitting: Emergency Medicine

## 2015-03-05 VITALS — BP 110/82 | HR 83 | Temp 98.6°F | Resp 18 | Ht 64.0 in | Wt 178.0 lb

## 2015-03-05 DIAGNOSIS — G44209 Tension-type headache, unspecified, not intractable: Secondary | ICD-10-CM

## 2015-03-05 DIAGNOSIS — E860 Dehydration: Secondary | ICD-10-CM

## 2015-03-05 DIAGNOSIS — Z791 Long term (current) use of non-steroidal anti-inflammatories (NSAID): Secondary | ICD-10-CM | POA: Insufficient documentation

## 2015-03-05 DIAGNOSIS — E079 Disorder of thyroid, unspecified: Secondary | ICD-10-CM | POA: Insufficient documentation

## 2015-03-05 DIAGNOSIS — F419 Anxiety disorder, unspecified: Secondary | ICD-10-CM | POA: Insufficient documentation

## 2015-03-05 DIAGNOSIS — Z792 Long term (current) use of antibiotics: Secondary | ICD-10-CM | POA: Insufficient documentation

## 2015-03-05 DIAGNOSIS — Z79899 Other long term (current) drug therapy: Secondary | ICD-10-CM | POA: Insufficient documentation

## 2015-03-05 DIAGNOSIS — Z3202 Encounter for pregnancy test, result negative: Secondary | ICD-10-CM | POA: Insufficient documentation

## 2015-03-05 LAB — POCT UA - MICROSCOPIC ONLY
Casts, Ur, LPF, POC: NEGATIVE
Mucus, UA: NEGATIVE
RBC, URINE, MICROSCOPIC: NEGATIVE
Yeast, UA: NEGATIVE

## 2015-03-05 LAB — CBC WITH DIFFERENTIAL/PLATELET
BASOS ABS: 0.1 10*3/uL (ref 0.0–0.1)
BASOS PCT: 1 % (ref 0–1)
EOS ABS: 0.2 10*3/uL (ref 0.0–0.7)
Eosinophils Relative: 2 % (ref 0–5)
HCT: 39.2 % (ref 36.0–46.0)
Hemoglobin: 12.7 g/dL (ref 12.0–15.0)
Lymphocytes Relative: 43 % (ref 12–46)
Lymphs Abs: 3.7 10*3/uL (ref 0.7–4.0)
MCH: 29.1 pg (ref 26.0–34.0)
MCHC: 32.4 g/dL (ref 30.0–36.0)
MCV: 89.7 fL (ref 78.0–100.0)
Monocytes Absolute: 0.9 10*3/uL (ref 0.1–1.0)
Monocytes Relative: 11 % (ref 3–12)
NEUTROS PCT: 43 % (ref 43–77)
Neutro Abs: 3.7 10*3/uL (ref 1.7–7.7)
PLATELETS: 253 10*3/uL (ref 150–400)
RBC: 4.37 MIL/uL (ref 3.87–5.11)
RDW: 12.7 % (ref 11.5–15.5)
WBC: 8.6 10*3/uL (ref 4.0–10.5)

## 2015-03-05 LAB — POCT URINALYSIS DIPSTICK
Bilirubin, UA: NEGATIVE
Blood, UA: NEGATIVE
Glucose, UA: NEGATIVE
Leukocytes, UA: NEGATIVE
Nitrite, UA: NEGATIVE
PROTEIN UA: NEGATIVE
Urobilinogen, UA: 0.2
pH, UA: 6

## 2015-03-05 LAB — I-STAT CHEM 8, ED
BUN: 8 mg/dL (ref 6–23)
CREATININE: 0.8 mg/dL (ref 0.50–1.10)
Calcium, Ion: 1.24 mmol/L — ABNORMAL HIGH (ref 1.12–1.23)
Chloride: 100 mmol/L (ref 96–112)
Glucose, Bld: 111 mg/dL — ABNORMAL HIGH (ref 70–99)
HEMATOCRIT: 42 % (ref 36.0–46.0)
Hemoglobin: 14.3 g/dL (ref 12.0–15.0)
Potassium: 3.6 mmol/L (ref 3.5–5.1)
Sodium: 139 mmol/L (ref 135–145)
TCO2: 24 mmol/L (ref 0–100)

## 2015-03-05 MED ORDER — KETOROLAC TROMETHAMINE 60 MG/2ML IM SOLN
60.0000 mg | Freq: Once | INTRAMUSCULAR | Status: AC
  Administered 2015-03-05: 60 mg via INTRAMUSCULAR

## 2015-03-05 NOTE — Progress Notes (Signed)
Subjective:    Patient ID: Hailey Rowland, female    DOB: [birth year], 24 y.o.   MRN: [id number]  Chief Complaint  Patient presents with  . Migraine    x1 week now   . Nausea  . Emesis  . Dizziness   Patient Active Problem List   Diagnosis Date Noted  . 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 Unspecified hypothyroidism 06/05/2013   Prior to Admission medications   Medication Sig Start Date End Date Taking? Authorizing Provider  ALPRAZolam Prudy Feeler) 1 MG tablet Prn once a day for anxiety 11/30/14  Yes Tonye Pearson, MD  butalbital-acetaminophen-caffeine (FIORICET) [id number] MG per tablet Take 1-2 tablets by mouth every 6 (six) hours as needed for headache. 03/02/15 03/01/16 Yes Elvina Sidle, MD  citalopram (CELEXA) 40 MG tablet Take 1 tablet (40 mg total) by mouth daily. 08/03/14  Yes Tonye Pearson, MD  clindamycin-benzoyl peroxide [facility]) gel Apply topically 2 (two) times daily. 06/03/13  Yes Tonye Pearson, MD  clonazePAM (KLONOPIN) 1 MG tablet Take 1 tablet (1 mg total) by mouth at bedtime. 11/30/14  Yes Tonye Pearson, MD  ibuprofen (ADVIL,MOTRIN) 800 MG tablet Take 1 tablet (800 mg total) by mouth 3 (three) times daily with meals. 06/17/14  Yes Mellody Drown, PA-C  levothyroxine (SYNTHROID, LEVOTHROID) 200 MCG tablet Take 1 tablet (200 mcg total) by mouth daily. 08/03/14  Yes Tonye Pearson, MD  promethazine (PHENERGAN) 25 MG tablet Take 1 tablet (25 mg total) by mouth every 8 (eight) hours as needed for nausea or vomiting. 11/30/14  Yes Tonye Pearson, MD  ranitidine (ZANTAC) 300 MG tablet Take 1 tablet (300 mg total) by mouth at bedtime. 11/30/14  Yes Tonye Pearson, MD  traZODone (DESYREL) 100 MG tablet TAKE ONE TABLET BY MOUTH EVERY DAY AT BEDTIME - NO MORE REFILLS WITHOUT OFFICE VISIT. 08/03/14  Yes Tonye Pearson, MD  valACYclovir (VALTREX) 1000 MG tablet Take 1 tablet (1,000 mg total) by mouth 2 (two) times daily. Take at  first onset of symptoms 09/30/13  Yes Tonye Pearson, MD  Butalbital-APAP-Caffeine 50-300-40 MG CAPS  03/03/15   Historical Provider, MD   Medications, allergies, past medical history, surgical history, family history, social history and problem list reviewed and updated.  HPI  89 yof with no pmh migraines since childhood presents with one wk constant HA.  Sx started fairly gradually one week ago. Had frontal HA which has persisted until today which is day 8. Rated 9/10 the entire time. Describes it as worst HA of life. Was gradual onset, not sudden onset. Assoc photo/phonophobia. No assoc vision changes. No neck stiffness. No recent uri sx or congestion. Has taken tylenol, ibuprofen, excedrin without relief. Was seen here few days ago and given fioricet without relief. States she has also been mildly dizzy the past week and had a witnessed syncopal episode 5 days ago. She came to quickly.   Never formally diagnosed with migraines. Intermittent HAs since childhood. Never this severe. No prophylactic meds.   Not under lot of stress. No alcohol. States she drinks lot of gatorade, some coffee, little water. Denies illicit drug use. Denies snoring.   Denies chills. Subjective fever once 3 days ago.   Review of Systems No cp, sob.     Objective:   Physical Exam  Constitutional: She is oriented to person, place, and time. She appears well-developed and well-nourished.  Non-toxic appearance. She does not have a sickly appearance. She does  not appear ill. No distress.  BP 110/82 mmHg  Pulse 83  Temp(Src) 98.6 F (37 C) (Oral)  Resp 18  Ht 5\' 4"  (1.626 m)  Wt 178 lb (80.74 kg)  BMI 30.54 kg/m2  SpO2 97%  Sitting up on side of bed. Not bothered by bright lights. Drinking gatorade and smoking vaporizer in room.    HENT:  Right Ear: Tympanic membrane normal.  Left Ear: Tympanic membrane normal.  Mouth/Throat: Uvula is midline, oropharynx is clear and moist and mucous membranes are normal.  No oropharyngeal exudate, posterior oropharyngeal edema, posterior oropharyngeal erythema or tonsillar abscesses.  Eyes: Conjunctivae and EOM are normal. Pupils are equal, round, and reactive to light.  Neck: Normal range of motion. No muscular tenderness present. No Brudzinski's sign noted.  Cardiovascular: Normal rate, regular rhythm and normal heart sounds.   Pulmonary/Chest: Effort normal and breath sounds normal.  Neurological: She is alert and oriented to person, place, and time. She has normal strength. No cranial nerve deficit or sensory deficit. She displays a negative Romberg sign.  Skin: Skin is warm and dry. No rash noted. She is not diaphoretic.  Psychiatric: She has a normal mood and affect. Her speech is normal.   Results for orders placed or performed in visit on 03/05/15  POCT urinalysis dipstick  Result Value Ref Range   Color, UA yellow    Clarity, UA clear    Glucose, UA neg    Bilirubin, UA neg    Ketones, UA trace    Spec Grav, UA >=1.030    Blood, UA neg    pH, UA 6.0    Protein, UA neg    Urobilinogen, UA 0.2    Nitrite, UA neg    Leukocytes, UA Negative   POCT UA - Microscopic Only  Result Value Ref Range   WBC, Ur, HPF, POC 6-8    RBC, urine, microscopic neg    Bacteria, U Microscopic 3+    Mucus, UA neg    Epithelial cells, urine per micros 2-4    Crystals, Ur, HPF, POC calcium oxalate    Casts, Ur, LPF, POC neg    Yeast, UA neg       Assessment & Plan:   [age] yof with no pmh migraines since childhood presents with one wk constant HA.  Tension-type headache, not intractable, unspecified chronicity pattern - Plan: ketorolac (TORADOL) injection 60 mg --HA sounds more tension type as frontal, assoc with lightheadedness, and pts specific gravity on ua is >1.030  --60 mg IM Toradol with no relief, pt has been taking fioricet with no relief --as is worst HA of life, intractable with oral meds or IM Toradol, 9/10, will send to ED by private vehicle for  further work up, need to r/o SAH, pt and mother agreeable and state they will go there upon leaving here  Dehydration - Plan: POCT urinalysis dipstick, POCT UA - Microscopic Only, Urine culture --severely dehydrated on ua --suspect this is causing HAs --encouraged decrease caffeine, increase water intake  Donnajean Lopesodd M. Aliviyah Malanga, PA-C Physician Assistant-Certified [facility]  03/05/2015 7:28 PM

## 2015-03-05 NOTE — ED Notes (Signed)
Pt reports migraines anterior head x1 week with dizziness.  Pt went to doctor today and was told she was dehydrated.  Pt alert and oriented.

## 2015-03-05 NOTE — Patient Instructions (Signed)
I think your headache is most likely from dehydration as you are very dehydrated today and you have been dizzy lately.  Since the headache is the worst you've ever had, is 9/10 for the past week, and cannot be improved with the 60 mg injection of toradol please go to the ER for further work up right away . They may do a CT scan of your head.

## 2015-03-05 NOTE — ED Notes (Signed)
Pt c/o migraine headache x's 7 days.  Has been seen by her MD and given meds without relief.  Pt was told by her MD to come to ED for eval   Pt also c/o nausea and vomiting

## 2015-03-06 LAB — URINALYSIS, ROUTINE W REFLEX MICROSCOPIC
Bilirubin Urine: NEGATIVE
Glucose, UA: NEGATIVE mg/dL
Hgb urine dipstick: NEGATIVE
Ketones, ur: NEGATIVE mg/dL
LEUKOCYTES UA: NEGATIVE
Nitrite: NEGATIVE
PH: 6 (ref 5.0–8.0)
Protein, ur: NEGATIVE mg/dL
Specific Gravity, Urine: 1.011 (ref 1.005–1.030)
Urobilinogen, UA: 0.2 mg/dL (ref 0.0–1.0)

## 2015-03-06 LAB — POC URINE PREG, ED: Preg Test, Ur: NEGATIVE

## 2015-03-06 MED ORDER — DEXAMETHASONE SODIUM PHOSPHATE 10 MG/ML IJ SOLN
10.0000 mg | Freq: Once | INTRAMUSCULAR | Status: AC
  Administered 2015-03-06: 10 mg via INTRAVENOUS
  Filled 2015-03-06: qty 1

## 2015-03-06 MED ORDER — DIPHENHYDRAMINE HCL 50 MG/ML IJ SOLN
25.0000 mg | Freq: Once | INTRAMUSCULAR | Status: DC
  Filled 2015-03-06: qty 1

## 2015-03-06 MED ORDER — PROCHLORPERAZINE EDISYLATE 5 MG/ML IJ SOLN
10.0000 mg | Freq: Once | INTRAMUSCULAR | Status: AC
  Administered 2015-03-06: 10 mg via INTRAVENOUS
  Filled 2015-03-06: qty 2

## 2015-03-06 MED ORDER — SODIUM CHLORIDE 0.9 % IV BOLUS (SEPSIS)
1000.0000 mL | INTRAVENOUS | Status: AC
  Administered 2015-03-06: 1000 mL via INTRAVENOUS

## 2015-03-06 NOTE — ED Provider Notes (Signed)
CSN: [id number]     Arrival date & time 03/05/15  2046 History   First MD Initiated Contact with Patient 03/05/15 2331     Chief Complaint  Patient presents with  . Headache   (Consider location/radiation/quality/duration/timing/severity/associated sxs/prior Treatment) HPI  Hailey Rowland is a 24 yo female presenting with report of headache.  She states this gradually 7 days ago and has been constant since then.  She reports sometimes it will will lessen and then get worse.  She reports some nausea and vomiting, the last episode was a few days ago.  She was seen by her PCP and given fiorcet 3 days without relief.  She was seen by Urgent care and treated with IM toradol without relief.  Mother states she has a tendency to get dehydrated.  She denies fevers, nuchal rigidity, blurred vision or focal neuro deficit.   Past Medical History  Diagnosis Date  . Anxiety   . Thyroid disease    Past Surgical History  Procedure Laterality Date  . Adenoidectomy    . Myringotomy     No family history on file. History  Substance Use Topics  . Smoking status: Never Smoker   . Smokeless tobacco: Never Used  . Alcohol Use: No   OB History    No data available     Review of Systems  Constitutional: Negative for fever and chills.  HENT: Negative for sore throat.   Eyes: Negative for visual disturbance.  Respiratory: Negative for cough and shortness of breath.   Cardiovascular: Negative for chest pain and leg swelling.  Gastrointestinal: Positive for nausea and vomiting. Negative for diarrhea.  Genitourinary: Negative for dysuria.  Musculoskeletal: Negative for myalgias.  Skin: Negative for rash.  Neurological: Positive for headaches. Negative for weakness and numbness.      Allergies  Antihistamines, chlorpheniramine-type  Home Medications   Prior to Admission medications   Medication Sig Start Date End Date Taking? Authorizing Provider  ALPRAZolam Prudy Feeler) 1 MG tablet Prn once a  day for anxiety 11/30/14   Tonye Pearson, MD  butalbital-acetaminophen-caffeine (FIORICET) [phone] MG per tablet Take 1-2 tablets by mouth every 6 (six) hours as needed for headache. 03/02/15 03/01/16  Elvina Sidle, MD  Butalbital-APAP-Caffeine 50-300-40 MG CAPS  03/03/15   Historical Provider, MD  citalopram (CELEXA) 40 MG tablet Take 1 tablet (40 mg total) by mouth daily. 08/03/14   Tonye Pearson, MD  clindamycin-benzoyl peroxide [facility]) gel Apply topically 2 (two) times daily. 06/03/13   Tonye Pearson, MD  clonazePAM (KLONOPIN) 1 MG tablet Take 1 tablet (1 mg total) by mouth at bedtime. 11/30/14   Tonye Pearson, MD  ibuprofen (ADVIL,MOTRIN) 800 MG tablet Take 1 tablet (800 mg total) by mouth 3 (three) times daily with meals. 06/17/14   Mellody Drown, PA-C  levothyroxine (SYNTHROID, LEVOTHROID) 200 MCG tablet Take 1 tablet (200 mcg total) by mouth daily. 08/03/14   Tonye Pearson, MD  promethazine (PHENERGAN) 25 MG tablet Take 1 tablet (25 mg total) by mouth every 8 (eight) hours as needed for nausea or vomiting. 11/30/14   Tonye Pearson, MD  ranitidine (ZANTAC) 300 MG tablet Take 1 tablet (300 mg total) by mouth at bedtime. 11/30/14   Tonye Pearson, MD  traZODone (DESYREL) 100 MG tablet TAKE ONE TABLET BY MOUTH EVERY DAY AT BEDTIME - NO MORE REFILLS WITHOUT OFFICE VISIT. 08/03/14   Tonye Pearson, MD  valACYclovir (VALTREX) 1000 MG tablet Take 1 tablet (1,000 mg total)  by mouth 2 (two) times daily. Take at first onset of symptoms 09/30/13   Tonye Pearsonobert P Doolittle, MD   BP 105/56 mmHg  Pulse 78  Temp(Src) 97.6 F (36.4 C) (Oral)  Resp 17  Ht 5' 4.5" (1.638 m)  Wt 178 lb (80.74 kg)  BMI 30.09 kg/m2  SpO2 100% Physical Exam  Constitutional: She is oriented to person, place, and time. She appears well-developed and well-nourished. No distress.  HENT:  Head: Normocephalic and atraumatic.  Mouth/Throat: Oropharynx is clear and moist.  Eyes: Conjunctivae are normal.  Pupils are equal, round, and reactive to light.  Neck: Normal range of motion. Neck supple.  Cardiovascular: Normal rate, regular rhythm and intact distal pulses.   Pulmonary/Chest: Effort normal and breath sounds normal. No respiratory distress. She has no wheezes. She has no rales. She exhibits no tenderness.  Abdominal: Soft. There is no tenderness.  Musculoskeletal: She exhibits no tenderness.  Lymphadenopathy:    She has no cervical adenopathy.  Neurological: She is alert and oriented to person, place, and time. She has normal strength. No cranial nerve deficit or sensory deficit. GCS eye subscore is 4. GCS verbal subscore is 5. GCS motor subscore is 6.  Cranial nerves 2-12.   Skin: Skin is warm and dry. No rash noted. She is not diaphoretic.  Psychiatric: She has a normal mood and affect.  Nursing note and vitals reviewed.   ED Course  Procedures (including critical care time) Labs Review Labs Reviewed  I-STAT CHEM 8, ED - Abnormal; Notable for the following:    Glucose, Bld 111 (*)    Calcium, Ion 1.24 (*)    All other components within normal limits  CBC WITH DIFFERENTIAL/PLATELET  URINALYSIS, ROUTINE W REFLEX MICROSCOPIC  POC URINE PREG, ED    Imaging Review No results found.   EKG Interpretation None      MDM   Final diagnoses:  Tension-type headache, not intractable, unspecified chronicity pattern   24 yo with continued gradual onset headache.  Treated with NS bolus and headache cocktail with improvement of symptoms. Her presentation is like pts typical HA and non concerning for [facility], ICH, Meningitis, or temporal arteritis. She is afebrile with no focal neuro deficits, nuchal rigidity, or change in vision. Pt is well-appearing, in no acute distress and vital signs reviewed and not concerning. She appears safe to be discharged.  Discharge include follow-up with their PCP.  Return precautions provided.  Pt verbalizes understanding and is agreeable with plan to dc.     Filed Vitals:   03/06/15 0030 03/06/15 0045 03/06/15 0130 03/06/15 0145  BP: 108/63 108/71 110/64 113/64  Pulse: 94 92 81 83  Temp:      TempSrc:      Resp: 19 15 14 12   Height:      Weight:      SpO2: 100% 98% 98% 98%       Harle BattiestElizabeth Travian Kerner, NP 03/06/15 1710  Richardean Canalavid H Yao, MD 03/08/15 1012

## 2015-03-06 NOTE — ED Notes (Signed)
Pt made aware to return if symptoms worsen or if any life threatening symptoms occur.   

## 2015-03-06 NOTE — Discharge Instructions (Signed)
Please follow the directions provided. Be sure to follow-up with your primary care doctor to ensure you're getting better. Be sure to drink plenty of fluids to ensure you're staying well-hydrated. A good role of thumb is to drink half your weight in ounces of water. An example would be if you way 150 pounds, you a drink 75 ounces of water. Take your prescribed medicines as directed and rest initially and slowly resume normal activities. Don't hesitate to return for any new, worsening, or concerning symptoms.   SEEK IMMEDIATE MEDICAL CARE IF:  Your headache becomes severe.  You have a fever.  You have a stiff neck.  You have loss of vision.  You have muscular weakness or loss of muscle control.  You start losing your balance or have trouble walking.  You feel faint or pass out.  You have severe symptoms that are different from your first symptoms.

## 2015-03-07 LAB — URINE CULTURE: Colony Count: 50000

## 2015-03-08 NOTE — Progress Notes (Signed)
  Medical screening examination/treatment/procedure(s) were performed by non-physician practitioner and as supervising physician I was immediately available for consultation/collaboration.     

## 2015-03-14 ENCOUNTER — Encounter: Payer: Self-pay | Admitting: Internal Medicine

## 2015-03-15 ENCOUNTER — Ambulatory Visit (INDEPENDENT_AMBULATORY_CARE_PROVIDER_SITE_OTHER): Payer: 59 | Admitting: Internal Medicine

## 2015-03-15 ENCOUNTER — Ambulatory Visit (INDEPENDENT_AMBULATORY_CARE_PROVIDER_SITE_OTHER): Payer: 59

## 2015-03-15 VITALS — BP 120/80 | HR 107 | Temp 97.4°F | Resp 16 | Ht 64.0 in | Wt 182.4 lb

## 2015-03-15 DIAGNOSIS — M545 Low back pain: Secondary | ICD-10-CM

## 2015-03-15 DIAGNOSIS — F411 Generalized anxiety disorder: Secondary | ICD-10-CM

## 2015-03-15 DIAGNOSIS — R112 Nausea with vomiting, unspecified: Secondary | ICD-10-CM

## 2015-03-15 DIAGNOSIS — G4700 Insomnia, unspecified: Secondary | ICD-10-CM

## 2015-03-15 LAB — POCT UA - MICROSCOPIC ONLY
Casts, Ur, LPF, POC: NEGATIVE
Crystals, Ur, HPF, POC: NEGATIVE
MUCUS UA: NEGATIVE
YEAST UA: NEGATIVE

## 2015-03-15 LAB — POCT URINALYSIS DIPSTICK
Bilirubin, UA: NEGATIVE
Blood, UA: NEGATIVE
GLUCOSE UA: NEGATIVE
KETONES UA: NEGATIVE
LEUKOCYTES UA: NEGATIVE
Nitrite, UA: NEGATIVE
Protein, UA: NEGATIVE
Spec Grav, UA: <=1.005
Urobilinogen, UA: 0.2
pH, UA: 6.5

## 2015-03-15 MED ORDER — DICYCLOMINE HCL 20 MG PO TABS: 20.0000 mg | ORAL_TABLET | Freq: Three times a day (TID) | ORAL | Status: DC

## 2015-03-15 MED ORDER — ONDANSETRON HCL 4 MG PO TABS: 4.0000 mg | ORAL_TABLET | Freq: Three times a day (TID) | ORAL | Status: DC | PRN

## 2015-03-15 MED ORDER — HYDROCODONE-ACETAMINOPHEN 5-325 MG PO TABS: 1.0000 | ORAL_TABLET | Freq: Four times a day (QID) | ORAL | Status: DC | PRN

## 2015-03-15 MED ORDER — CYCLOBENZAPRINE HCL 10 MG PO TABS: 10.0000 mg | ORAL_TABLET | Freq: Three times a day (TID) | ORAL | Status: DC | PRN

## 2015-03-15 MED ORDER — MELOXICAM 15 MG PO TABS: 15.0000 mg | ORAL_TABLET | Freq: Every day | ORAL | Status: DC
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
## 2015-03-24 ENCOUNTER — Encounter: Payer: Self-pay | Admitting: Internal Medicine

## 2015-03-24 DIAGNOSIS — R1115 Cyclical vomiting syndrome unrelated to migraine: Secondary | ICD-10-CM

## 2015-03-25 ENCOUNTER — Encounter: Payer: Self-pay | Admitting: Internal Medicine

## 2015-03-25 MED ORDER — ONDANSETRON HCL 4 MG PO TABS: 4.0000 mg | ORAL_TABLET | Freq: Three times a day (TID) | ORAL | Status: DC | PRN

## 2015-03-25 NOTE — Telephone Encounter (Signed)
Needs further eval--time to reck tsh ---? CT abd or MRI back--she could have a spondylolisthesis or spinal stenosis although her physical examination is not particularly revealing at this point Pain seems unusual in the fact that it requires narcotics and so this could even be some intra-abdominal pathology, some renal pathology or some bone pathology that we have not suspected

## 2015-03-29 NOTE — Telephone Encounter (Signed)
Gi eval next

## 2015-03-29 NOTE — Addendum Note (Signed)
Addended by: Tonye PearsonOLITTLE, Kolbee Stallman P on: 03/29/2015 11:07 AM   Modules accepted: Orders

## 2015-04-05 ENCOUNTER — Encounter: Payer: Self-pay | Admitting: Family Medicine

## 2015-04-05 ENCOUNTER — Ambulatory Visit (INDEPENDENT_AMBULATORY_CARE_PROVIDER_SITE_OTHER): Payer: 59 | Admitting: Family Medicine

## 2015-04-05 ENCOUNTER — Other Ambulatory Visit: Payer: Self-pay | Admitting: Family Medicine

## 2015-04-05 ENCOUNTER — Encounter: Payer: Self-pay | Admitting: Nurse Practitioner

## 2015-04-05 VITALS — BP 126/82 | HR 116 | Temp 98.3°F | Resp 16 | Ht 64.0 in | Wt 182.8 lb

## 2015-04-05 DIAGNOSIS — R1084 Generalized abdominal pain: Secondary | ICD-10-CM

## 2015-04-05 DIAGNOSIS — R1110 Vomiting, unspecified: Secondary | ICD-10-CM

## 2015-04-05 DIAGNOSIS — E039 Hypothyroidism, unspecified: Secondary | ICD-10-CM

## 2015-04-05 DIAGNOSIS — M545 Low back pain, unspecified: Secondary | ICD-10-CM

## 2015-04-05 LAB — CBC
HEMATOCRIT: 36.2 % (ref 36.0–46.0)
Hemoglobin: 12.1 g/dL (ref 12.0–15.0)
MCH: 29.2 pg (ref 26.0–34.0)
MCHC: 33.4 g/dL (ref 30.0–36.0)
MCV: 87.2 fL (ref 78.0–100.0)
MPV: 12.4 fL (ref 8.6–12.4)
PLATELETS: 243 10*3/uL (ref 150–400)
RBC: 4.15 MIL/uL (ref 3.87–5.11)
RDW: 13 % (ref 11.5–15.5)
WBC: 7.8 10*3/uL (ref 4.0–10.5)

## 2015-04-05 MED ORDER — OMEPRAZOLE 20 MG PO CPDR: 20.0000 mg | DELAYED_RELEASE_CAPSULE | Freq: Every day | ORAL | Status: DC

## 2015-04-05 MED ORDER — NAPROXEN 500 MG PO TABS
500.0000 mg | ORAL_TABLET | Freq: Two times a day (BID) | ORAL | Status: DC
Start: 2015-04-05 — End: 2015-04-16

## 2015-04-05 MED ORDER — ONDANSETRON HCL 4 MG PO TABS: 4.0000 mg | ORAL_TABLET | Freq: Three times a day (TID) | ORAL | Status: AC | PRN
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
## 2015-04-06 ENCOUNTER — Telehealth: Payer: Self-pay | Admitting: Family Medicine

## 2015-04-06 LAB — TSH: TSH: 65.781 u[IU]/mL — AB (ref 0.350–4.500)

## 2015-04-06 NOTE — Telephone Encounter (Signed)
Attempted to call patient regarding TSH. No answer. Will try her again later.

## 2015-04-07 ENCOUNTER — Encounter: Payer: Self-pay | Admitting: Internal Medicine

## 2015-04-07 ENCOUNTER — Telehealth: Payer: Self-pay

## 2015-04-07 ENCOUNTER — Encounter: Payer: Self-pay | Admitting: Family Medicine

## 2015-04-07 ENCOUNTER — Other Ambulatory Visit: Payer: Self-pay | Admitting: Family Medicine

## 2015-04-07 DIAGNOSIS — E039 Hypothyroidism, unspecified: Secondary | ICD-10-CM

## 2015-04-07 MED ORDER — LEVOTHYROXINE SODIUM 200 MCG PO TABS: 200.0000 ug | ORAL_TABLET | Freq: Every day | ORAL | Status: DC

## 2015-04-07 MED ORDER — LEVOTHYROXINE SODIUM 50 MCG PO TABS: ORAL_TABLET | ORAL | Status: DC

## 2015-04-07 NOTE — Telephone Encounter (Signed)
I called patient and spoke to her about her labs and the need to reintroduce her synthroid gradually since she had been off it awhile. I reviewed the instructions that I emailed to her and she verbalized understanding.

## 2015-04-07 NOTE — Telephone Encounter (Signed)
Pt called back in regards to missed call from Deboraha Sprangebbie Gessner. Jasmine spoke with patient and let her know that PA Leone PayorGessner had attempted to call her, but the line was busy and she would try back again. Patient hung up, and called right back. I picked up on her phone call, and patient states, " No body there can ever tell me anything, hopefully you will be more helpful." I read to patient the message left in the system by Deboraha Sprangebbie Gessner, and I advised her that I am not a clinical staff member, either is [place]asmine, and that we are not at liberty to discuss lab results with patients, but Deboraha SprangDebbie Gessner would get back to her at her next available convenience. Patient yelled a few profanities, and hung up the phone.

## 2015-04-07 NOTE — Telephone Encounter (Signed)
She has sent several emails, please call [name].

## 2015-04-08 ENCOUNTER — Telehealth: Payer: Self-pay

## 2015-04-08 ENCOUNTER — Telehealth: Payer: Self-pay | Admitting: Family Medicine

## 2015-04-08 LAB — T4, FREE: FREE T4: 0.62 ng/dL — AB (ref 0.80–1.80)

## 2015-04-08 LAB — T3: T3 TOTAL: 91.6 ng/dL (ref 80.0–204.0)

## 2015-04-08 NOTE — Telephone Encounter (Signed)
lmom for patient to give us a call back 

## 2015-04-08 NOTE — Telephone Encounter (Signed)
-----   Message from Tonye Pearsonobert P Doolittle, MD sent at 04/08/2015  9:15 AM EDT ----- Add free t4, T3 and prolactin if possible to labs done by Harlin Heys Gessner Dx can be hypothyroidism

## 2015-04-08 NOTE — Telephone Encounter (Signed)
Test added.   

## 2015-04-08 NOTE — Telephone Encounter (Signed)
-----   Message from Emi Belfasteborah B Gessner, FNP sent at 04/07/2015  3:27 PM EDT ----- Please call patient and schedule her a 2 month follow up with me or Dr. Merla Richesoolittle. Thanks, Ameren CorporationDebbie

## 2015-04-09 LAB — PROLACTIN: Prolactin: 29.5 ng/mL

## 2015-04-10 ENCOUNTER — Encounter: Payer: Self-pay | Admitting: Internal Medicine

## 2015-04-10 ENCOUNTER — Ambulatory Visit (INDEPENDENT_AMBULATORY_CARE_PROVIDER_SITE_OTHER): Payer: 59 | Admitting: Internal Medicine

## 2015-04-10 VITALS — BP 112/70 | HR 118 | Temp 97.8°F | Ht 64.0 in | Wt 181.0 lb

## 2015-04-10 DIAGNOSIS — E049 Nontoxic goiter, unspecified: Secondary | ICD-10-CM | POA: Diagnosis not present

## 2015-04-10 DIAGNOSIS — G8929 Other chronic pain: Secondary | ICD-10-CM | POA: Diagnosis not present

## 2015-04-10 DIAGNOSIS — E229 Hyperfunction of pituitary gland, unspecified: Secondary | ICD-10-CM

## 2015-04-10 DIAGNOSIS — G4489 Other headache syndrome: Secondary | ICD-10-CM

## 2015-04-10 DIAGNOSIS — R946 Abnormal results of thyroid function studies: Secondary | ICD-10-CM

## 2015-04-10 DIAGNOSIS — M545 Low back pain: Secondary | ICD-10-CM | POA: Diagnosis not present

## 2015-04-10 DIAGNOSIS — R7989 Other specified abnormal findings of blood chemistry: Secondary | ICD-10-CM

## 2015-04-10 MED ORDER — HYDROCODONE-ACETAMINOPHEN 5-325 MG PO TABS: 1.0000 | ORAL_TABLET | Freq: Four times a day (QID) | ORAL | Status: DC | PRN
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
## 2015-04-12 DIAGNOSIS — G4489 Other headache syndrome: Secondary | ICD-10-CM | POA: Insufficient documentation

## 2015-04-12 DIAGNOSIS — M545 Low back pain: Principal | ICD-10-CM

## 2015-04-12 DIAGNOSIS — G8929 Other chronic pain: Secondary | ICD-10-CM | POA: Insufficient documentation

## 2015-04-14 ENCOUNTER — Encounter: Payer: Self-pay | Admitting: Gastroenterology

## 2015-04-14 ENCOUNTER — Telehealth: Payer: Self-pay | Admitting: Family Medicine

## 2015-04-14 NOTE — Telephone Encounter (Signed)
lmom to call to reschedule appt on 05/17/15 from her evening appointment to morning appt Mrs Hailey BlaseDebbie will be out of the office after 12 that day

## 2015-04-16 ENCOUNTER — Encounter: Payer: Self-pay | Admitting: Nurse Practitioner

## 2015-04-16 ENCOUNTER — Ambulatory Visit (INDEPENDENT_AMBULATORY_CARE_PROVIDER_SITE_OTHER): Payer: 59 | Admitting: Nurse Practitioner

## 2015-04-16 VITALS — BP 118/72 | HR 88 | Ht 64.0 in | Wt 185.4 lb

## 2015-04-16 DIAGNOSIS — R194 Change in bowel habit: Secondary | ICD-10-CM

## 2015-04-16 DIAGNOSIS — R112 Nausea with vomiting, unspecified: Secondary | ICD-10-CM | POA: Diagnosis not present

## 2015-04-16 DIAGNOSIS — K219 Gastro-esophageal reflux disease without esophagitis: Secondary | ICD-10-CM | POA: Diagnosis not present

## 2015-04-16 DIAGNOSIS — K648 Other hemorrhoids: Secondary | ICD-10-CM | POA: Diagnosis not present

## 2015-04-16 DIAGNOSIS — K602 Anal fissure, unspecified: Secondary | ICD-10-CM

## 2015-04-16 MED ORDER — HYDROCORTISONE ACETATE 25 MG RE SUPP: 25.0000 mg | Freq: Every day | RECTAL | Status: DC

## 2015-04-16 MED ORDER — DILTIAZEM GEL 2 %: 1.0000 "application " | Freq: Three times a day (TID) | CUTANEOUS | Status: DC

## 2015-04-16 NOTE — Patient Instructions (Addendum)
You have been scheduled for an endoscopy. Please follow written instructions given to you at your visit today. If you use inhalers (even only as needed), please bring them with you on the day of your procedure.  We have sent the following medications to your pharmacy for you to pick up at your convenience: [facility] Address: [address], [place], [place] [address]  [phone] anusol supp daily at hs Diltiazem gel  Please purchase the following medications over the counter and take as directed: Take Citrucel 1/2 tablespoon daily and work up to 1 heaping tablespoon over a week

## 2015-04-16 NOTE — Progress Notes (Signed)
HPI :  Patient is a 24 year old female referred by PCP.  Patient has several gastrointestinal issues.  Nausea and vomiting / belching / bloating. Symptoms present for a year, are unrelated to meals. No weight loss, she has gained weight. Pepto-Bismol sometimes helps the vomiting . Zofran helps but patient often cannot hold it down. She cannot take Phenergan as it causes excessive sleepiness. She complains of blood in her emesis.  Patient gives a 6 month history of alternating constipation and diarrhea with associated lower abdominal pain. She has seen blood on the tissue when wiping. Overall patient is constipated. She has tried fiber in the past but it caused nausea and diarrhea. Dicyclomine causes blurry vision  Patient gives a several year history of GERD. Zantac  controls her symptoms.  Recent workup: Urinalysis unremarkable. Normal renal function. Slightly elevated ionized calcium 1.24. Normal CBC  Past Medical History  Diagnosis Date  . Anxiety   . Hypothyroidism   . Insomnia   . Headache syndrome   . HSV-2 (herpes simplex virus 2) infection     History reviewed. No pertinent family history. History  Substance Use Topics  . Smoking status: Never Smoker   . Smokeless tobacco: Never Used  . Alcohol Use: No   Current Outpatient Prescriptions  Medication Sig Dispense Refill  . ALPRAZolam (XANAX) 1 MG tablet Prn once a day for anxiety 30 tablet 2  . citalopram (CELEXA) 40 MG tablet Take 1 tablet (40 mg total) by mouth daily. 90 tablet 3  . clindamycin-benzoyl peroxide (BENZACLIN) gel Apply topically 2 (two) times daily. 25 g 3  . clonazePAM (KLONOPIN) 1 MG tablet Take 1 tablet (1 mg total) by mouth at bedtime. 30 tablet 5  . cyclobenzaprine (FLEXERIL) 10 MG tablet Take 1 tablet (10 mg total) by mouth 3 (three) times daily as needed for muscle spasms. 60 tablet 0  . ibuprofen (ADVIL,MOTRIN) 800 MG tablet Take 1 tablet (800 mg total) by mouth 3 (three) times daily with meals.  21 tablet 0  . levothyroxine (SYNTHROID, LEVOTHROID) 200 MCG tablet Take 1 tablet (200 mcg total) by mouth daily. 30 tablet 1  . ondansetron (ZOFRAN) 4 MG tablet Take 1 tablet (4 mg total) by mouth every 8 (eight) hours as needed for nausea or vomiting. 30 tablet 0  . ranitidine (ZANTAC) 300 MG tablet Take 1 tablet (300 mg total) by mouth at bedtime. 30 tablet 2  . traZODone (DESYREL) 100 MG tablet TAKE ONE TABLET BY MOUTH EVERY DAY AT BEDTIME - NO MORE REFILLS WITHOUT OFFICE VISIT. 30 tablet 11  . valACYclovir (VALTREX) 1000 MG tablet Take 1 tablet (1,000 mg total) by mouth 2 (two) times daily. Take at first onset of symptoms 6 tablet 5   No current facility-administered medications for this visit.   Allergies  Allergen Reactions  . Antihistamines, Chlorpheniramine-Type Other (See Comments)    unknown     Review of Systems: All systems reviewed and negative except where noted in HPI.   Physical Exam: BP 118/72 mmHg  Pulse 88  Ht  (1.626 m)  Wt 185 lb 6.4 oz (84.097 kg)  BMI 31.81 kg/m2 Constitutional: Pleasant,well-developed, white female in no acute distress. HEENT: Normocephalic and atraumatic. Conjunctivae are normal. No scleral icterus. Neck supple.  Cardiovascular: Normal rate, regular rhythm.  Pulmonary/chest: Effort normal and breath sounds normal. No wheezing, rales or rhonchi. Abdominal: Soft, nondistended, nontender. Bowel sounds active throughout. There are no masses palpable. No hepatomegaly. Rectal: No external inflamed hemorrhoids. No  masses on DRE. On anoscopy patient had inflamed internal hemorrhoids. She had a small posterior midline fissure. Extremities: no edema Lymphadenopathy: No cervical adenopathy noted. Neurological: Alert and oriented to person place and time. Skin: Skin is warm and dry. No rashes noted. Psychiatric: Normal mood and affect. Behavior is normal.   ASSESSMENT AND PLAN:  561. 24 year old female with several gastrointestinal issues.  She gives a one-year history of nausea and vomiting. She is seen blood in emesis. She has belching and bloating. Suspect her symptoms are functional in nature. The hematemesis may be from esophagitis from vomiting. Will schedule her for EGD to rule out organic disease. The benefits, risks, and potential complications of EGD with possible biopsies were discussed with the patient and she agrees to proceed. Further workup depending on upper endoscopy results  2. Six month history of alternating constipation /diarrhea / lower abdominal pain. She has seen blood in her stool (as well as emesis). Intolerant of fiber as it causes nausea. Dicyclomine causes blurry vision. Suspect symptoms are functional, probably irritable bowel syndrome. Bleeding most likely secondary to an hemorrhoids and fissure seen on DRE/anoscopy but will schedule her for colonoscopy to rule out organic disease. The risks, benefits, and alternatives to colonoscopy with possible biopsy and possible polypectomy were discussed with the patient and she consents to proceed. Steroid suppositories prescribed for hemorrhoids and diltiazem gel for anal fissure. She was educated on how to apply the diltiazem gel   3. Depression / anxiety / bipolar / thyroid disease   Olean Reeeborah Gessner, NP

## 2015-04-19 NOTE — Progress Notes (Signed)
Reviewed and agree with management. Fatema Rabe D. Dayami Taitt, M.D., FACG  

## 2015-04-23 ENCOUNTER — Other Ambulatory Visit: Payer: Self-pay | Admitting: Internal Medicine

## 2015-04-27 ENCOUNTER — Telehealth: Payer: Self-pay | Admitting: Radiology

## 2015-04-27 NOTE — Telephone Encounter (Signed)
Called in rx and notified pt via voicemail. 

## 2015-05-01 ENCOUNTER — Encounter: Payer: Self-pay | Admitting: Internal Medicine

## 2015-05-02 ENCOUNTER — Inpatient Hospital Stay: Admission: RE | Admit: 2015-05-02 | Payer: Self-pay | Source: Ambulatory Visit

## 2015-05-02 ENCOUNTER — Other Ambulatory Visit: Payer: Self-pay

## 2015-05-17 ENCOUNTER — Ambulatory Visit: Payer: 59 | Admitting: Family Medicine

## 2015-06-07 ENCOUNTER — Ambulatory Visit: Payer: 59 | Admitting: Gastroenterology

## 2015-06-07 NOTE — Progress Notes (Addendum)
On interview when questioned of NPO status, patient stating she had a "sip of water" at 1500 . Patient stating she was thirsty. Patient also stating she had crackers at 0800. in Informed Dr. Arlyce Dice and Abbott Pao CRNA. On return to bedside , questioned patient again. Patient stating she actually had a bottle of water. Informed Dr. Arlyce Dice and Abbott Pao, procedure cancelled related to patient safety and noncompliance with preprocedure instructions. Patient rescheduled for previsit and procedure. Patient verbalized agreement with plan and understanding of safety concern regarding her NPO status and anesthesia.

## 2015-06-09 ENCOUNTER — Ambulatory Visit (AMBULATORY_SURGERY_CENTER): Payer: Self-pay | Admitting: *Deleted

## 2015-06-09 VITALS — Ht 64.0 in | Wt 178.0 lb

## 2015-06-09 DIAGNOSIS — R112 Nausea with vomiting, unspecified: Secondary | ICD-10-CM

## 2015-06-09 NOTE — Progress Notes (Signed)
Patient denies any allergies to eggs or soy. Patient denies any problems with anesthesia/sedation. Patient denies any oxygen use at home and does not take any diet/weight loss medications. EMMI education assisgned to patient on Upper Endoscopy, this was explained and instructions given to patient.

## 2015-06-14 ENCOUNTER — Other Ambulatory Visit: Payer: Self-pay | Admitting: Internal Medicine

## 2015-06-14 NOTE — Telephone Encounter (Signed)
Meds ordered this encounter  Medications  . ALPRAZolam (XANAX) 1 MG tablet    Sig: TAKE ONE TABLET BY MOUTH ONCE DAILY AS NEEDED FOR ANXIETY    Dispense:  30 tablet    Refill:  5

## 2015-06-14 NOTE — Telephone Encounter (Signed)
Rx faxed

## 2015-06-15 ENCOUNTER — Ambulatory Visit (AMBULATORY_SURGERY_CENTER): Payer: 59 | Admitting: Gastroenterology

## 2015-06-15 ENCOUNTER — Encounter: Payer: Self-pay | Admitting: Gastroenterology

## 2015-06-15 VITALS — BP 114/69 | HR 95 | Temp 98.6°F | Resp 27 | Ht 64.0 in | Wt 178.0 lb

## 2015-06-15 DIAGNOSIS — R112 Nausea with vomiting, unspecified: Secondary | ICD-10-CM | POA: Diagnosis not present

## 2015-06-15 DIAGNOSIS — K209 Esophagitis, unspecified without bleeding: Secondary | ICD-10-CM

## 2015-06-15 MED ORDER — DEXLANSOPRAZOLE 60 MG PO CPDR: 60.0000 mg | DELAYED_RELEASE_CAPSULE | Freq: Every day | ORAL | Status: AC

## 2015-06-15 MED ORDER — SODIUM CHLORIDE 0.9 % IV SOLN: 500.0000 mL | INTRAVENOUS | Status: DC

## 2015-06-15 NOTE — Op Note (Signed)
[facility] [address] [place], [address]   ENDOSCOPY PROCEDURE REPORT  PATIENT: Hailey Rowland, Hailey Rowland  MR#: [id number] BIRTHDATE: 1991-07-19 , 23  yrs. old GENDER: female ENDOSCOPIST: Louis Meckel, MD REFERRED BY:  Ellamae Sia, M.D. PROCEDURE DATE:  06/15/2015 PROCEDURE:  EGD, diagnostic ASA CLASS:     Class II INDICATIONS:  vomiting and nausea. MEDICATIONS: Monitored anesthesia care and Propofol 160 mg IV TOPICAL ANESTHETIC:  DESCRIPTION OF PROCEDURE: After the risks benefits and alternatives of the procedure were thoroughly explained, informed consent was obtained.  The [id number] endoscope was introduced through the mouth and advanced to the second portion of the duodenum , Without limitations.  The instrument was slowly withdrawn as the mucosa was fully examined.    ESOPHAGUS: There was LA Class A esophagitis (One or more mucosal breaks < 5 mm in maximal length) noted.   Except for the findings listed, the EGD was otherwise normal.  Retroflexed views revealed no abnormalities.     The scope was then withdrawn from the patient and the procedure completed.  COMPLICATIONS: There were no immediate complications.  ENDOSCOPIC IMPRESSION: 1.   There was LA Class A esophagitis noted 2.   EGD was otherwise normal  RECOMMENDATIONS: Begin dexilant 60 mg daily (discontinue ranitidine) gastric emptying scan  REPEAT EXAM:  eSigned:  Louis Meckel, MD 06/15/2015 1:53 PM    CC:

## 2015-06-15 NOTE — Patient Instructions (Addendum)
YOU HAD AN ENDOSCOPIC PROCEDURE TODAY AT THE [facility]:   Refer to the procedure report that was given to you for any specific questions about what was found during the examination.  If the procedure report does not answer your questions, please call your gastroenterologist to clarify.  If you requested that your care partner not be given the details of your procedure findings, then the procedure report has been included in a sealed envelope for you to review at your convenience later.  YOU SHOULD EXPECT: Some feelings of bloating in the abdomen. Passage of more gas than usual.  Walking can help get rid of the air that was put into your GI tract during the procedure and reduce the bloating. If you had a lower endoscopy (such as a colonoscopy or flexible sigmoidoscopy) you may notice spotting of blood in your stool or on the toilet paper. If you underwent a bowel prep for your procedure, you may not have a normal bowel movement for a few days.  Please Note:  You might notice some irritation and congestion in your nose or some drainage.  This is from the oxygen used during your procedure.  There is no need for concern and it should clear up in a day or so.  SYMPTOMS TO REPORT IMMEDIATELY:     Following upper endoscopy (EGD)  Vomiting of blood or coffee ground material  New chest pain or pain under the shoulder blades  Painful or persistently difficult swallowing  New shortness of breath  Fever of 100F or higher  Black, tarry-looking stools  For urgent or emergent issues, a gastroenterologist can be reached at any hour by calling [phone].   DIET: Your first meal following the procedure should be a small meal and then it is ok to progress to your normal diet. Heavy or fried foods are harder to digest and may make you feel nauseous or bloated.  Likewise, meals heavy in dairy and vegetables can increase bloating.  Drink plenty of fluids but you should avoid alcoholic beverages  for 24 hours.  ACTIVITY:  You should plan to take it easy for the rest of today and you should NOT DRIVE or use heavy machinery until tomorrow (because of the sedation medicines used during the test).    FOLLOW UP: Our staff will call the number listed on your records the next business day following your procedure to check on you and address any questions or concerns that you may have regarding the information given to you following your procedure. If we do not reach you, we will leave a message.  However, if you are feeling well and you are not experiencing any problems, there is no need to return our call.  We will assume that you have returned to your regular daily activities without incident.  If any biopsies were taken you will be contacted by phone or by letter within the next 1-3 weeks.  Please call us at [phone] if you have not heard about the biopsies in 3 weeks.    SIGNATURES/CONFIDENTIALITY: You and/or your care partner have signed paperwork which will be entered into your electronic medical record.  These signatures attest to the fact that that the information above on your After Visit Summary has been reviewed and is understood.  Full responsibility of the confidentiality of this discharge information lies with you and/or your care-partner.   Discontinue Ranitidine  Dexilant 60 mg daily (take 30 minutes before first meal of the day)  Gastric Emptying study will be ordered by Dr Marzetta Board office and you will be notified by them when tis is set up.

## 2015-06-15 NOTE — Progress Notes (Signed)
To recovery, report to [place], RN, VSS 

## 2015-06-16 ENCOUNTER — Telehealth: Payer: Self-pay | Admitting: *Deleted

## 2015-06-16 ENCOUNTER — Telehealth: Payer: Self-pay

## 2015-06-16 NOTE — Telephone Encounter (Signed)
  Follow up Call-  Call back number 06/15/2015 06/07/2015  Post procedure Call Back phone  # [phone]  Permission to leave phone message Yes Yes     Patient questions:  Do you have a fever, pain , or abdominal swelling? No. Pain Score  0 *  Have you tolerated food without any problems? Yes.    Have you been able to return to your normal activities? No.  Do you have any questions about your discharge instructions: Diet   No. Medications  No. Follow up visit  No.  Do you have questions or concerns about your Care? No.  Actions: * If pain score is 4 or above: No action needed, pain <4.

## 2015-06-16 NOTE — Telephone Encounter (Signed)
I have left message a for the patient to call back. She had an EGD yesterday 06/15/15. Dr Arlyce Dice wants to have a GES done. I need to know what her schedule is.

## 2015-06-29 ENCOUNTER — Other Ambulatory Visit: Payer: Self-pay

## 2015-06-29 NOTE — Telephone Encounter (Signed)
Letter mailed to the patient

## 2015-07-13 ENCOUNTER — Other Ambulatory Visit: Payer: Self-pay | Admitting: Internal Medicine

## 2015-07-14 ENCOUNTER — Telehealth: Payer: Self-pay | Admitting: Family Medicine

## 2015-07-14 NOTE — Telephone Encounter (Signed)
I called [facility] and verified that they DO have RFs on file for the alprazolam, but they do need RF of clonazepam.

## 2015-07-14 NOTE — Telephone Encounter (Signed)
Time for f/u to recheck thyroid function Has she had mri back yet??? Meds ordered this encounter  Medications  . clonazePAM (KLONOPIN) 1 MG tablet    Sig: TAKE ONE TABLET BY MOUTH AT BEDTIME    Dispense:  30 tablet    Refill:  0

## 2015-07-14 NOTE — Telephone Encounter (Signed)
lmom to pick RX up

## 2015-07-14 NOTE — Telephone Encounter (Signed)
I have left message for the patient to call back 

## 2015-08-11 ENCOUNTER — Ambulatory Visit: Payer: 59 | Admitting: Internal Medicine

## 2015-08-14 ENCOUNTER — Other Ambulatory Visit: Payer: Self-pay | Admitting: Internal Medicine

## 2015-08-26 ENCOUNTER — Other Ambulatory Visit: Payer: Self-pay | Admitting: Internal Medicine

## 2015-08-30 NOTE — Telephone Encounter (Signed)
Rx faxed

## 2015-09-08 ENCOUNTER — Ambulatory Visit: Payer: 59 | Admitting: Internal Medicine

## 2015-09-08 ENCOUNTER — Telehealth: Payer: Self-pay | Admitting: *Deleted

## 2015-09-08 NOTE — Telephone Encounter (Signed)
Called patient because she did not show for her 3:30 pm and left message in voice mail.

## 2015-10-05 ENCOUNTER — Encounter (HOSPITAL_COMMUNITY): Payer: Self-pay | Admitting: *Deleted

## 2015-10-05 ENCOUNTER — Emergency Department (HOSPITAL_COMMUNITY)
Admission: EM | Admit: 2015-10-05 | Discharge: 2015-10-05 | Disposition: A | Discharge status: Home / Self Care | Payer: 59 | Attending: Emergency Medicine | Admitting: Emergency Medicine

## 2015-10-05 DIAGNOSIS — F419 Anxiety disorder, unspecified: Secondary | ICD-10-CM | POA: Insufficient documentation

## 2015-10-05 DIAGNOSIS — G4700 Insomnia, unspecified: Secondary | ICD-10-CM | POA: Insufficient documentation

## 2015-10-05 DIAGNOSIS — E039 Hypothyroidism, unspecified: Secondary | ICD-10-CM | POA: Insufficient documentation

## 2015-10-05 DIAGNOSIS — Z792 Long term (current) use of antibiotics: Secondary | ICD-10-CM | POA: Insufficient documentation

## 2015-10-05 DIAGNOSIS — M5432 Sciatica, left side: Secondary | ICD-10-CM

## 2015-10-05 DIAGNOSIS — R200 Anesthesia of skin: Secondary | ICD-10-CM | POA: Insufficient documentation

## 2015-10-05 DIAGNOSIS — Z720 Tobacco use: Secondary | ICD-10-CM | POA: Insufficient documentation

## 2015-10-05 DIAGNOSIS — R202 Paresthesia of skin: Secondary | ICD-10-CM | POA: Insufficient documentation

## 2015-10-05 DIAGNOSIS — Z79899 Other long term (current) drug therapy: Secondary | ICD-10-CM | POA: Insufficient documentation

## 2015-10-05 DIAGNOSIS — Z8619 Personal history of other infectious and parasitic diseases: Secondary | ICD-10-CM | POA: Insufficient documentation

## 2015-10-05 MED ORDER — NAPROXEN 500 MG PO TABS: 500.0000 mg | ORAL_TABLET | Freq: Two times a day (BID) | ORAL | Status: DC

## 2015-10-05 MED ORDER — OXYCODONE-ACETAMINOPHEN 5-325 MG PO TABS
1.0000 | ORAL_TABLET | Freq: Once | ORAL | Status: AC
  Administered 2015-10-05: 1 via ORAL
  Filled 2015-10-05: qty 1

## 2015-10-05 MED ORDER — CYCLOBENZAPRINE HCL 10 MG PO TABS: 10.0000 mg | ORAL_TABLET | Freq: Two times a day (BID) | ORAL | Status: DC | PRN

## 2015-10-05 MED ORDER — PREDNISONE 20 MG PO TABS: ORAL_TABLET | ORAL | Status: DC
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
## 2015-10-05 NOTE — Discharge Instructions (Signed)
Radicular Pain °Radicular pain in either the arm or leg is usually from a bulging or herniated disk in the spine. A piece of the herniated disk may press against the nerves as the nerves exit the spine. This causes pain which is felt at the tips of the nerves down the arm or leg. Other causes of radicular pain may include: °· Fractures. °· Heart disease. °· Cancer. °· An abnormal and usually degenerative state of the nervous system or nerves (neuropathy). °Diagnosis may require CT or MRI scanning to determine the primary cause.  °Nerves that start at the neck (nerve roots) may cause radicular pain in the outer shoulder and arm. It can spread down to the thumb and fingers. The symptoms vary depending on which nerve root has been affected. In most cases radicular pain improves with conservative treatment. Neck problems may require physical therapy, a neck collar, or cervical traction. Treatment may take many weeks, and surgery may be considered if the symptoms do not improve.  °Conservative treatment is also recommended for sciatica. Sciatica causes pain to radiate from the lower back or buttock area down the leg into the foot. Often there is a history of back problems. Most patients with sciatica are better after 2 to 4 weeks of rest and other supportive care. Short term bed rest can reduce the disk pressure considerably. Sitting, however, is not a good position since this increases the pressure on the disk. You should avoid bending, lifting, and all other activities which make the problem worse. Traction can be used in severe cases. Surgery is usually reserved for patients who do not improve within the first months of treatment. °Only take over-the-counter or prescription medicines for pain, discomfort, or fever as directed by your caregiver. Narcotics and muscle relaxants may help by relieving more severe pain and spasm and by providing mild sedation. Cold or massage can give significant relief. Spinal manipulation  is not recommended. It can increase the degree of disc protrusion. Epidural steroid injections are often effective treatment for radicular pain. These injections deliver medicine to the spinal nerve in the space between the protective covering of the spinal cord and back bones (vertebrae). Your caregiver can give you more information about steroid injections. These injections are most effective when given within two weeks of the onset of pain.  °You should see your caregiver for follow up care as recommended. A program for neck and back injury rehabilitation with stretching and strengthening exercises is an important part of management.  °SEEK IMMEDIATE MEDICAL CARE IF: °· You develop increased pain, weakness, or numbness in your arm or leg. °· You develop difficulty with bladder or bowel control. °· You develop abdominal pain. °  °This information is not intended to replace advice given to you by your health care provider. Make sure you discuss any questions you have with your health care provider. °  °Document Released: 01/18/2005 Document Revised: 01/01/2015 Document Reviewed: 07/07/2015 °Elsevier Interactive Patient Education ©2016 [employer] ° °

## 2015-10-05 NOTE — ED Notes (Signed)
Pt states numbness on outer left thigh from 2 inches below hip to 2 inches above left knee. Pt states area sometimes has "wierd tingling sensation."

## 2015-10-07 ENCOUNTER — Other Ambulatory Visit: Payer: Self-pay | Admitting: Internal Medicine

## 2015-10-08 NOTE — Telephone Encounter (Signed)
rx printed.  Meds ordered this encounter  Medications  . clonazePAM (KLONOPIN) 1 MG tablet    Sig: TAKE ONE TABLET BY MOUTH AT BEDTIME    Dispense:  30 tablet    Refill:  0

## 2015-10-11 ENCOUNTER — Encounter: Payer: Self-pay | Admitting: Internal Medicine

## 2015-10-11 NOTE — Telephone Encounter (Signed)
Dr Merla Richesoolittle, please read note from pt. Chelle just RFd pt's clonazepam when pharm req'd it, but it looks like pt wants to inc dose of xanax instead of taking both. I will keep clonazepam Rx at my desk until you advise.

## 2015-10-11 NOTE — Telephone Encounter (Signed)
I am holding Rx at my desk until Dr Merla Richesoolittle reviews pt's email req.

## 2015-10-12 ENCOUNTER — Other Ambulatory Visit: Payer: Self-pay | Admitting: Internal Medicine

## 2015-10-12 ENCOUNTER — Telehealth: Payer: Self-pay

## 2015-10-12 ENCOUNTER — Encounter: Payer: Self-pay | Admitting: Internal Medicine

## 2015-10-12 ENCOUNTER — Other Ambulatory Visit: Payer: Self-pay | Admitting: Physician Assistant

## 2015-10-12 NOTE — Telephone Encounter (Signed)
i sent her mychart note suggesting we increase trazadone instead--awaiting her response

## 2015-10-12 NOTE — Telephone Encounter (Signed)
Does she need an OV to increase medications?

## 2015-10-12 NOTE — Telephone Encounter (Signed)
Pt would like Dr Merla Richesoolittle to up her Prudy FeelerXANAX from 1 mg to 2 mg, states she have gotten so use to the 1 mg. Please call [phone]

## 2015-10-12 NOTE — Telephone Encounter (Signed)
Xanax is not a good sleep drug as it interferes with REM and deep sleep so you are nonrestored the next day It would be better to increase trazadone to 100 or 150 at bedtime

## 2015-10-20 ENCOUNTER — Other Ambulatory Visit: Payer: Self-pay | Admitting: Internal Medicine

## 2015-10-21 NOTE — Telephone Encounter (Signed)
Dr Merla Richesoolittle, I went ahead and RFd the trazadone at 100 mg Qhs, because in one of pt's responses to you on 10/11/15 email, she stated that she can not tolerate SE from increase of trazadone to 150. Do you want me to go ahead and call in the RF of clonazepam 1 mg that Chelle wrote in your absence? I will send reply back to pt again about trying the temazepam.

## 2015-10-21 NOTE — Telephone Encounter (Signed)
Good to ref traz--we can change meds to remeron if Hailey Rowland is too troublesome Also can use up to 2mg  benzo a day---either klon or xanax with one dose being hs--if needs more as indicated then we need to add a new med like Buspar for anxiety control It would also be possible to refer her to psychiatry for another approach to her problems AND CBT would help as well

## 2015-10-21 NOTE — Telephone Encounter (Signed)
I tried to re-send Dr Netta Corriganoolittle's mychart message from 10/17 and couldn't see the messages he and pt had exchanged in the encounter. Checked with Maralyn SagoSarah and she determined that they weren't showing up because pt's Mychart has been de-activated. It doesn't look like pt has gotten the message from Dr Merla Richesoolittle, possibly, and this is why she hasn't responded. I had called pt and LMOM to CB so will wait for her call.

## 2015-10-25 NOTE — Telephone Encounter (Signed)
This is correct to stay with protocol or needs reeval--she might qualify for [facility] if no insurance

## 2015-10-25 NOTE — Telephone Encounter (Signed)
Called pt again because I had not heard back. (it appears that her MyChart is active again). Pt did say that she has gotten some responses [facility] [place] [place] [facility] I explained Dr Doolittle's message about medication directions and possible changes and pt advised the following: 1. trazadone is effective for controlling pt's anxiety, but NOT effective for sleep, and if she increases dose more in order to try to be effective for sleep, she has the SEs. 2. Pt needs the xanax during the day to control her anxiety. 2. Pt is taking clonazepam at night, but feels that she needs 2 mg at night for sleep.  I advised pt to NOT take any more than 1 mg of xanax during the day for anxiety and then she can have 1 mg of clonazepem at bedtime, but not 2 mg. Repeated these instr's to NOT take more than a total of 2 mg QD of benzos QD. Pt voiced understanding, but I will say that pt's speech was slowed as if she was under influence of benzo or alcohol (I am not familiar with pt's normal speech however) and if so, may not understand or follow directions. I advised that pt should really come in to see Dr Merla Richesoolittle to try to come up with a new plan, but she stated that her [employer] care ins was cancelled and she can not afford it. In light of this I did not discuss psychiatry appt at this time.  Dr Merla Richesoolittle, do you want to add or change any medication?

## 2015-10-26 NOTE — Telephone Encounter (Signed)
Called pt back to give her the info that she should continue as we discussed yesterday, or will need to RTC to discuss changes in treatment per Dr Merla Riche[facility]olittle. [facility]. [facility], [address], [phone] to see if she qualifies for treatment there w/no ins.

## 2015-11-01 ENCOUNTER — Encounter: Payer: Self-pay | Admitting: Internal Medicine

## 2015-11-02 MED ORDER — TRAZODONE HCL 100 MG PO TABS: ORAL_TABLET | ORAL | Status: DC

## 2015-11-22 ENCOUNTER — Encounter: Payer: Self-pay | Admitting: Internal Medicine

## 2015-12-14 ENCOUNTER — Other Ambulatory Visit: Payer: Self-pay | Admitting: Physician Assistant

## 2015-12-14 NOTE — Telephone Encounter (Signed)
Meds ordered this encounter  Medications  . clonazePAM (KLONOPIN) 1 MG tablet    Sig: TAKE ONE TABLET BY MOUTH AT BEDTIME    Dispense:  30 tablet    Refill:  2   Then f/u to reassess

## 2015-12-15 NOTE — Telephone Encounter (Signed)
Called in Rx and notified mother of need for f/up. She agreed.

## 2015-12-17 ENCOUNTER — Other Ambulatory Visit: Payer: Self-pay | Admitting: Internal Medicine

## 2015-12-21 NOTE — Telephone Encounter (Signed)
Had received another fax from pharm for RF of alprazolam and not sure that the one written 12/23 was ever sent in. I called in RF as written 12/23.

## 2015-12-31 ENCOUNTER — Emergency Department (HOSPITAL_COMMUNITY)
Admission: EM | Admit: 2015-12-31 | Discharge: 2015-12-31 | Disposition: A | Discharge status: Home / Self Care | Payer: Self-pay | Attending: Emergency Medicine | Admitting: Emergency Medicine

## 2015-12-31 ENCOUNTER — Encounter (HOSPITAL_COMMUNITY): Payer: Self-pay | Admitting: Emergency Medicine

## 2015-12-31 DIAGNOSIS — Z79899 Other long term (current) drug therapy: Secondary | ICD-10-CM | POA: Insufficient documentation

## 2015-12-31 DIAGNOSIS — E039 Hypothyroidism, unspecified: Secondary | ICD-10-CM | POA: Insufficient documentation

## 2015-12-31 DIAGNOSIS — F17210 Nicotine dependence, cigarettes, uncomplicated: Secondary | ICD-10-CM | POA: Insufficient documentation

## 2015-12-31 DIAGNOSIS — S3992XA Unspecified injury of lower back, initial encounter: Secondary | ICD-10-CM | POA: Insufficient documentation

## 2015-12-31 DIAGNOSIS — Y9389 Activity, other specified: Secondary | ICD-10-CM | POA: Insufficient documentation

## 2015-12-31 DIAGNOSIS — Z791 Long term (current) use of non-steroidal anti-inflammatories (NSAID): Secondary | ICD-10-CM | POA: Insufficient documentation

## 2015-12-31 DIAGNOSIS — F419 Anxiety disorder, unspecified: Secondary | ICD-10-CM | POA: Insufficient documentation

## 2015-12-31 DIAGNOSIS — G4700 Insomnia, unspecified: Secondary | ICD-10-CM | POA: Insufficient documentation

## 2015-12-31 DIAGNOSIS — M5432 Sciatica, left side: Secondary | ICD-10-CM

## 2015-12-31 DIAGNOSIS — Z792 Long term (current) use of antibiotics: Secondary | ICD-10-CM | POA: Insufficient documentation

## 2015-12-31 DIAGNOSIS — Y92410 Unspecified street and highway as the place of occurrence of the external cause: Secondary | ICD-10-CM | POA: Insufficient documentation

## 2015-12-31 DIAGNOSIS — Z8619 Personal history of other infectious and parasitic diseases: Secondary | ICD-10-CM | POA: Insufficient documentation

## 2015-12-31 DIAGNOSIS — Y998 Other external cause status: Secondary | ICD-10-CM | POA: Insufficient documentation

## 2015-12-31 DIAGNOSIS — Z8679 Personal history of other diseases of the circulatory system: Secondary | ICD-10-CM | POA: Insufficient documentation

## 2015-12-31 DIAGNOSIS — G8929 Other chronic pain: Secondary | ICD-10-CM | POA: Insufficient documentation

## 2015-12-31 MED ORDER — NAPROXEN 500 MG PO TABS: 500.0000 mg | ORAL_TABLET | Freq: Two times a day (BID) | ORAL | Status: DC

## 2015-12-31 MED ORDER — PREDNISONE 20 MG PO TABS: ORAL_TABLET | ORAL | Status: DC

## 2015-12-31 MED ORDER — CYCLOBENZAPRINE HCL 10 MG PO TABS: 10.0000 mg | ORAL_TABLET | Freq: Two times a day (BID) | ORAL | Status: DC | PRN

## 2015-12-31 NOTE — Discharge Instructions (Signed)
Radicular Pain °Radicular pain in either the arm or leg is usually from a bulging or herniated disk in the spine. A piece of the herniated disk may press against the nerves as the nerves exit the spine. This causes pain which is felt at the tips of the nerves down the arm or leg. Other causes of radicular pain may include: °· Fractures. °· Heart disease. °· Cancer. °· An abnormal and usually degenerative state of the nervous system or nerves (neuropathy). °Diagnosis may require CT or MRI scanning to determine the primary cause.  °Nerves that start at the neck (nerve roots) may cause radicular pain in the outer shoulder and arm. It can spread down to the thumb and fingers. The symptoms vary depending on which nerve root has been affected. In most cases radicular pain improves with conservative treatment. Neck problems may require physical therapy, a neck collar, or cervical traction. Treatment may take many weeks, and surgery may be considered if the symptoms do not improve.  °Conservative treatment is also recommended for sciatica. Sciatica causes pain to radiate from the lower back or buttock area down the leg into the foot. Often there is a history of back problems. Most patients with sciatica are better after 2 to 4 weeks of rest and other supportive care. Short term bed rest can reduce the disk pressure considerably. Sitting, however, is not a good position since this increases the pressure on the disk. You should avoid bending, lifting, and all other activities which make the problem worse. Traction can be used in severe cases. Surgery is usually reserved for patients who do not improve within the first months of treatment. °Only take over-the-counter or prescription medicines for pain, discomfort, or fever as directed by your caregiver. Narcotics and muscle relaxants may help by relieving more severe pain and spasm and by providing mild sedation. Cold or massage can give significant relief. Spinal manipulation  is not recommended. It can increase the degree of disc protrusion. Epidural steroid injections are often effective treatment for radicular pain. These injections deliver medicine to the spinal nerve in the space between the protective covering of the spinal cord and back bones (vertebrae). Your caregiver can give you more information about steroid injections. These injections are most effective when given within two weeks of the onset of pain.  °You should see your caregiver for follow up care as recommended. A program for neck and back injury rehabilitation with stretching and strengthening exercises is an important part of management.  °SEEK IMMEDIATE MEDICAL CARE IF: °· You develop increased pain, weakness, or numbness in your arm or leg. °· You develop difficulty with bladder or bowel control. °· You develop abdominal pain. °  °This information is not intended to replace advice given to you by your health care provider. Make sure you discuss any questions you have with your health care provider. °  °Document Released: 01/18/2005 Document Revised: 01/01/2015 Document Reviewed: 07/07/2015 °[employer] Interactive Patient Education ©2016 [employer] ° °Sciatica With Rehab °The sciatic nerve runs from the back down the leg and is responsible for sensation and control of the muscles in the back (posterior) side of the thigh, lower leg, and foot. Sciatica is a condition that is characterized by inflammation of this nerve.  °SYMPTOMS  °· Signs of nerve damage, including numbness and/or weakness along the posterior side of the lower extremity. °· Pain in the back of the thigh that may also travel down the leg. °· Pain that worsens when sitting for   long periods of time. °· Occasionally, pain in the back or buttock. °CAUSES  °Inflammation of the sciatic nerve is the cause of sciatica. The inflammation is due to something irritating the nerve. Common sources of irritation include: °· Sitting for long periods of  time. °· Direct trauma to the nerve. °· Arthritis of the spine. °· Herniated or ruptured disk. °· Slipping of the vertebrae (spondylolisthesis). °· Pressure from soft tissues, such as muscles or ligament-like tissue (fascia). °RISK INCREASES WITH: °· Sports that place pressure or stress on the spine (football or weightlifting). °· Poor strength and flexibility. °· Failure to warm up properly before activity. °· Family history of low back pain or disk disorders. °· Previous back injury or surgery. °· Poor body mechanics, especially when lifting, or poor posture. °PREVENTION  °· Warm up and stretch properly before activity. °· Maintain physical fitness: °¨ Strength, flexibility, and endurance. °· Cardiovascular fitness. °· Learn and use proper technique, especially with posture and lifting. When possible, have coach correct improper technique. °· Avoid activities that place stress on the spine. °PROGNOSIS °If treated properly, then sciatica usually resolves within 6 weeks. However, occasionally surgery is necessary.  °RELATED COMPLICATIONS  °· Permanent nerve damage, including pain, numbness, tingle, or weakness. °· Chronic back pain. °· Risks of surgery: infection, bleeding, nerve damage, or damage to surrounding tissues. °TREATMENT °Treatment initially involves resting from any activities that aggravate your symptoms. The use of ice and medication may help reduce pain and inflammation. The use of strengthening and stretching exercises may help reduce pain with activity. These exercises may be performed at home or with referral to a therapist. A therapist may recommend further treatments, such as transcutaneous electronic nerve stimulation (TENS) or ultrasound. Your caregiver may recommend corticosteroid injections to help reduce inflammation of the sciatic nerve. If symptoms persist despite non-surgical (conservative) treatment, then surgery may be recommended. °MEDICATION °· If pain medication is necessary, then  nonsteroidal anti-inflammatory medications, such as aspirin and ibuprofen, or other minor pain relievers, such as acetaminophen, are often recommended. °· Do not take pain medication for 7 days before surgery. °· Prescription pain relievers may be given if deemed necessary by your caregiver. Use only as directed and only as much as you need. °· Ointments applied to the skin may be helpful. °· Corticosteroid injections may be given by your caregiver. These injections should be reserved for the most serious cases, because they may only be given a certain number of times. °HEAT AND COLD °· Cold treatment (icing) relieves pain and reduces inflammation. Cold treatment should be applied for 10 to 15 minutes every 2 to 3 hours for inflammation and pain and immediately after any activity that aggravates your symptoms. Use ice packs or massage the area with a piece of ice (ice massage). °· Heat treatment may be used prior to performing the stretching and strengthening activities prescribed by your caregiver, physical therapist, or athletic trainer. Use a heat pack or soak the injury in warm water. °SEEK MEDICAL CARE IF: °· Treatment seems to offer no benefit, or the condition worsens. °· Any medications produce adverse side effects. °EXERCISES  °RANGE OF MOTION (ROM) AND STRETCHING EXERCISES - Sciatica °Most people with sciatic will find that their symptoms worsen with either excessive bending forward (flexion) or arching at the low back (extension). The exercises which will help resolve your symptoms will focus on the opposite motion. Your physician, physical therapist or athletic trainer will help you determine which exercises will be most helpful to   resolve your low back pain. Do not complete any exercises without first consulting with your clinician. Discontinue any exercises which worsen your symptoms until you speak to your clinician. If you have pain, numbness or tingling which travels down into your buttocks, leg or  foot, the goal of the therapy is for these symptoms to move closer to your back and eventually resolve. Occasionally, these leg symptoms will get better, but your low back pain may worsen; this is typically an indication of progress in your rehabilitation. Be certain to be very alert to any changes in your symptoms and the activities in which you participated in the 24 hours prior to the change. Sharing this information with your clinician will allow him/her to most efficiently treat your condition. °These exercises may help you when beginning to rehabilitate your injury. Your symptoms may resolve with or without further involvement from your physician, physical therapist or athletic trainer. While completing these exercises, remember:  °· Restoring tissue flexibility helps normal motion to return to the joints. This allows healthier, less painful movement and activity. °· An effective stretch should be held for at least 30 seconds. °· A stretch should never be painful. You should only feel a gentle lengthening or release in the stretched tissue. °FLEXION RANGE OF MOTION AND STRETCHING EXERCISES: °STRETCH - Flexion, Single Knee to Chest  °· Lie on a firm bed or floor with both legs extended in front of you. °· Keeping one leg in contact with the floor, bring your opposite knee to your chest. Hold your leg in place by either grabbing behind your thigh or at your knee. °· Pull until you feel a gentle stretch in your low back. Hold __________ seconds. °· Slowly release your grasp and repeat the exercise with the opposite side. °Repeat __________ times. Complete this exercise __________ times per day.  °STRETCH - Flexion, Double Knee to Chest °· Lie on a firm bed or floor with both legs extended in front of you. °· Keeping one leg in contact with the floor, bring your opposite knee to your chest. °· Tense your stomach muscles to support your back and then lift your other knee to your chest. Hold your legs in place by  either grabbing behind your thighs or at your knees. °· Pull both knees toward your chest until you feel a gentle stretch in your low back. Hold __________ seconds. °· Tense your stomach muscles and slowly return one leg at a time to the floor. °Repeat __________ times. Complete this exercise __________ times per day.  °STRETCH - Low Trunk Rotation  °· Lie on a firm bed or floor. Keeping your legs in front of you, bend your knees so they are both pointed toward the ceiling and your feet are flat on the floor. °· Extend your arms out to the side. This will stabilize your upper body by keeping your shoulders in contact with the floor. °· Gently and slowly drop both knees together to one side until you feel a gentle stretch in your low back. Hold for __________ seconds. °· Tense your stomach muscles to support your low back as you bring your knees back to the starting position. Repeat the exercise to the other side. °Repeat __________ times. Complete this exercise __________ times per day  °EXTENSION RANGE OF MOTION AND FLEXIBILITY EXERCISES: °STRETCH - Extension, Prone on Elbows °· Lie on your stomach on the floor, a bed will be too soft. Place your palms about shoulder width apart and at the   height of your head. °· Place your elbows under your shoulders. If this is too painful, stack pillows under your chest. °· Allow your body to relax so that your hips drop lower and make contact more completely with the floor. °· Hold this position for __________ seconds. °· Slowly return to lying flat on the floor. °Repeat __________ times. Complete this exercise __________ times per day.  °RANGE OF MOTION - Extension, Prone Press Ups °· Lie on your stomach on the floor, a bed will be too soft. Place your palms about shoulder width apart and at the height of your head. °· Keeping your back as relaxed as possible, slowly straighten your elbows while keeping your hips on the floor. You may adjust the placement of your hands to  maximize your comfort. As you gain motion, your hands will come more underneath your shoulders. °· Hold this position __________ seconds. °· Slowly return to lying flat on the floor. °Repeat __________ times. Complete this exercise __________ times per day.  °STRENGTHENING EXERCISES - Sciatica  °These exercises may help you when beginning to rehabilitate your injury. These exercises should be done near your "sweet spot." This is the neutral, low-back arch, somewhere between fully rounded and fully arched, that is your least painful position. When performed in this safe range of motion, these exercises can be used for people who have either a flexion or extension based injury. These exercises may resolve your symptoms with or without further involvement from your physician, physical therapist or athletic trainer. While completing these exercises, remember:  °· Muscles can gain both the endurance and the strength needed for everyday activities through controlled exercises. °· Complete these exercises as instructed by your physician, physical therapist or athletic trainer. Progress with the resistance and repetition exercises only as your caregiver advises. °· You may experience muscle soreness or fatigue, but the pain or discomfort you are trying to eliminate should never worsen during these exercises. If this pain does worsen, stop and make certain you are following the directions exactly. If the pain is still present after adjustments, discontinue the exercise until you can discuss the trouble with your clinician. °STRENGTHENING - Deep Abdominals, Pelvic Tilt  °· Lie on a firm bed or floor. Keeping your legs in front of you, bend your knees so they are both pointed toward the ceiling and your feet are flat on the floor. °· Tense your lower abdominal muscles to press your low back into the floor. This motion will rotate your pelvis so that your tail bone is scooping upwards rather than pointing at your feet or into  the floor. °· With a gentle tension and even breathing, hold this position for __________ seconds. °Repeat __________ times. Complete this exercise __________ times per day.  °STRENGTHENING - Abdominals, Crunches  °· Lie on a firm bed or floor. Keeping your legs in front of you, bend your knees so they are both pointed toward the ceiling and your feet are flat on the floor. Cross your arms over your chest. °· Slightly tip your chin down without bending your neck. °· Tense your abdominals and slowly lift your trunk high enough to just clear your shoulder blades. Lifting higher can put excessive stress on the low back and does not further strengthen your abdominal muscles. °· Control your return to the starting position. °Repeat __________ times. Complete this exercise __________ times per day.  °STRENGTHENING - Quadruped, Opposite UE/LE Lift °· Assume a hands and knees position on a firm surface.   Keep your hands under your shoulders and your knees under your hips. You may place padding under your knees for comfort. °· Find your neutral spine and gently tense your abdominal muscles so that you can maintain this position. Your shoulders and hips should form a rectangle that is parallel with the floor and is not twisted. °· Keeping your trunk steady, lift your right hand no higher than your shoulder and then your left leg no higher than your hip. Make sure you are not holding your breath. Hold this position __________ seconds. °· Continuing to keep your abdominal muscles tense and your back steady, slowly return to your starting position. Repeat with the opposite arm and leg. °Repeat __________ times. Complete this exercise __________ times per day.  °STRENGTHENING - Abdominals and Quadriceps, Straight Leg Raise  °· Lie on a firm bed or floor with both legs extended in front of you. °· Keeping one leg in contact with the floor, bend the other knee so that your foot can rest flat on the floor. °· Find your neutral spine,  and tense your abdominal muscles to maintain your spinal position throughout the exercise. °· Slowly lift your straight leg off the floor about 6 inches for a count of 15, making sure to not hold your breath. °· Still keeping your neutral spine, slowly lower your leg all the way to the floor. °Repeat this exercise with each leg __________ times. Complete this exercise __________ times per day. °POSTURE AND BODY MECHANICS CONSIDERATIONS - Sciatica °Keeping correct posture when sitting, standing or completing your activities will reduce the stress put on different body tissues, allowing injured tissues a chance to heal and limiting painful experiences. The following are general guidelines for improved posture. Your physician or physical therapist will provide you with any instructions specific to your needs. While reading these guidelines, remember: °· The exercises prescribed by your provider will help you have the flexibility and strength to maintain correct postures. °· The correct posture provides the optimal environment for your joints to work. All of your joints have less wear and tear when properly supported by a spine with good posture. This means you will experience a healthier, less painful body. °· Correct posture must be practiced with all of your activities, especially prolonged sitting and standing. Correct posture is as important when doing repetitive low-stress activities (typing) as it is when doing a single heavy-load activity (lifting). °RESTING POSITIONS °Consider which positions are most painful for you when choosing a resting position. If you have pain with flexion-based activities (sitting, bending, stooping, squatting), choose a position that allows you to rest in a less flexed posture. You would want to avoid curling into a fetal position on your side. If your pain worsens with extension-based activities (prolonged standing, working overhead), avoid resting in an extended position such as  sleeping on your stomach. Most people will find more comfort when they rest with their spine in a more neutral position, neither too rounded nor too arched. Lying on a non-sagging bed on your side with a pillow between your knees, or on your back with a pillow under your knees will often provide some relief. Keep in mind, being in any one position for a prolonged period of time, no matter how correct your posture, can still lead to stiffness. °PROPER SITTING POSTURE °In order to minimize stress and discomfort on your spine, you must sit with correct posture Sitting with good posture should be effortless for a healthy body. Returning to good   posture is a gradual process. Many people can work toward this most comfortably by using various supports until they have the flexibility and strength to maintain this posture on their own. When sitting with proper posture, your ears will fall over your shoulders and your shoulders will fall over your hips. You should use the back of the chair to support your upper back. Your low back will be in a neutral position, just slightly arched. You may place a small pillow or folded towel at the base of your low back for support.  When working at a desk, create an environment that supports good, upright posture. Without extra support, muscles fatigue and lead to excessive strain on joints and other tissues. Keep these recommendations in mind: CHAIR:   A chair should be able to slide under your desk when your back makes contact with the back of the chair. This allows you to work closely.  The chair's height should allow your eyes to be level with the upper part of your monitor and your hands to be slightly lower than your elbows. BODY POSITION  Your feet should make contact with the floor. If this is not possible, use a foot rest.  Keep your ears over your shoulders. This will reduce stress on your neck and low back. INCORRECT SITTING POSTURES   If you are feeling tired and  unable to assume a healthy sitting posture, do not slouch or slump. This puts excessive strain on your back tissues, causing more damage and pain. Healthier options include:  Using more support, like a lumbar pillow.  Switching tasks to something that requires you to be upright or walking.  Talking a brief walk.  Lying down to rest in a neutral-spine position. PROLONGED STANDING WHILE SLIGHTLY LEANING FORWARD  When completing a task that requires you to lean forward while standing in one place for a long time, place either foot up on a stationary 2-4 inch high object to help maintain the best posture. When both feet are on the ground, the low back tends to lose its slight inward curve. If this curve flattens (or becomes too large), then the back and your other joints will experience too much stress, fatigue more quickly and can cause pain.  CORRECT STANDING POSTURES Proper standing posture should be assumed with all daily activities, even if they only take a few moments, like when brushing your teeth. As in sitting, your ears should fall over your shoulders and your shoulders should fall over your hips. You should keep a slight tension in your abdominal muscles to brace your spine. Your tailbone should point down to the ground, not behind your body, resulting in an over-extended swayback posture.  INCORRECT STANDING POSTURES  Common incorrect standing postures include a forward head, locked knees and/or an excessive swayback. WALKING Walk with an upright posture. Your ears, shoulders and hips should all line-up. PROLONGED ACTIVITY IN A FLEXED POSITION When completing a task that requires you to bend forward at your waist or lean over a low surface, try to find a way to stabilize 3 of 4 of your limbs. You can place a hand or elbow on your thigh or rest a knee on the surface you are reaching across. This will provide you more stability so that your muscles do not fatigue as quickly. By keeping your  knees relaxed, or slightly bent, you will also reduce stress across your low back. CORRECT LIFTING TECHNIQUES DO :   Assume a wide stance. This will  provide you more stability and the opportunity to get as close as possible to the object which you are lifting.  Tense your abdominals to brace your spine; then bend at the knees and hips. Keeping your back locked in a neutral-spine position, lift using your leg muscles. Lift with your legs, keeping your back straight.  Test the weight of unknown objects before attempting to lift them.  Try to keep your elbows locked down at your sides in order get the best strength from your shoulders when carrying an object.  Always ask for help when lifting heavy or awkward objects. INCORRECT LIFTING TECHNIQUES DO NOT:   Lock your knees when lifting, even if it is a small object.  Bend and twist. Pivot at your feet or move your feet when needing to change directions.  Assume that you cannot safely pick up a paperclip without proper posture.   This information is not intended to replace advice given to you by your health care provider. Make sure you discuss any questions you have with your health care provider.  Follow up with your primary care provider for re-evaluation. Apply heat to affected area. Return to the ED if you experience numbness/tingling in both of your lower extremities, bowel/bladder incontinence, fever, inability to bear weight.

## 2015-12-31 NOTE — ED Notes (Signed)
Bed: [id number] Expected date:  Expected time:  Means of arrival:  Comments: EMS- Jimmye NormanFarmer
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
## 2016-01-20 ENCOUNTER — Other Ambulatory Visit: Payer: Self-pay | Admitting: Physician Assistant

## 2016-01-23 ENCOUNTER — Encounter: Payer: Self-pay | Admitting: Internal Medicine

## 2016-02-03 ENCOUNTER — Other Ambulatory Visit: Payer: Self-pay | Admitting: Internal Medicine

## 2016-02-03 ENCOUNTER — Other Ambulatory Visit: Payer: Self-pay | Admitting: Family Medicine

## 2016-02-03 ENCOUNTER — Encounter: Payer: Self-pay | Admitting: Internal Medicine

## 2016-02-03 MED ORDER — CITALOPRAM HYDROBROMIDE 40 MG PO TABS: 40.0000 mg | ORAL_TABLET | Freq: Every day | ORAL | Status: DC

## 2016-02-03 MED ORDER — ALPRAZOLAM 1 MG PO TABS: ORAL_TABLET | ORAL | Status: DC

## 2016-02-03 MED ORDER — CLONAZEPAM 1 MG PO TABS: 1.0000 mg | ORAL_TABLET | Freq: Every day | ORAL | Status: AC

## 2016-02-03 MED ORDER — TRAZODONE HCL 100 MG PO TABS: ORAL_TABLET | ORAL | Status: DC

## 2016-02-03 NOTE — Telephone Encounter (Signed)
meds ref appt pending Meds ordered this encounter  Medications  . clonazePAM (KLONOPIN) 1 MG tablet    Sig: Take 1 tablet (1 mg total) by mouth at bedtime.    Dispense:  30 tablet    Refill:  0  . ALPRAZolam (XANAX) 1 MG tablet    Sig: TAKE ONE TABLET BY MOUTH ONCE DAILY AS NEEDED FOR ANXIETY    Dispense:  30 tablet    Refill:  0  . citalopram (CELEXA) 40 MG tablet    Sig: Take 1 tablet (40 mg total) by mouth daily.    Dispense:  30 tablet    Refill:  0  . traZODone (DESYREL) 100 MG tablet    Sig: TAKE ONE TABLET BY MOUTH AT BEDTIME    Dispense:  30 tablet    Refill:  0

## 2016-02-04 NOTE — Telephone Encounter (Signed)
Called in both the alprazolam and clonazepam to [facility] as req'd by pt on other email. Notified pt on MyChart.

## 2016-02-11 NOTE — Telephone Encounter (Signed)
This was done in refill encounter

## 2016-03-12 ENCOUNTER — Ambulatory Visit (INDEPENDENT_AMBULATORY_CARE_PROVIDER_SITE_OTHER): Payer: BLUE CROSS/BLUE SHIELD | Admitting: Internal Medicine

## 2016-03-12 VITALS — BP 120/82 | HR 111 | Temp 99.0°F | Resp 16 | Ht 64.0 in | Wt 211.0 lb

## 2016-03-12 DIAGNOSIS — Z6836 Body mass index (BMI) 36.0-36.9, adult: Secondary | ICD-10-CM

## 2016-03-12 DIAGNOSIS — M5416 Radiculopathy, lumbar region: Secondary | ICD-10-CM

## 2016-03-12 DIAGNOSIS — E039 Hypothyroidism, unspecified: Secondary | ICD-10-CM | POA: Diagnosis not present

## 2016-03-12 DIAGNOSIS — F411 Generalized anxiety disorder: Secondary | ICD-10-CM

## 2016-03-12 DIAGNOSIS — J22 Unspecified acute lower respiratory infection: Secondary | ICD-10-CM

## 2016-03-12 DIAGNOSIS — J988 Other specified respiratory disorders: Secondary | ICD-10-CM

## 2016-03-12 DIAGNOSIS — F341 Dysthymic disorder: Secondary | ICD-10-CM

## 2016-03-12 MED ORDER — ALPRAZOLAM 1 MG PO TABS: 0.5000 mg | ORAL_TABLET | Freq: Every evening | ORAL | Status: DC | PRN

## 2016-03-12 MED ORDER — TRAZODONE HCL 100 MG PO TABS: ORAL_TABLET | ORAL | Status: AC

## 2016-03-12 MED ORDER — GABAPENTIN 300 MG PO CAPS: 300.0000 mg | ORAL_CAPSULE | Freq: Two times a day (BID) | ORAL | Status: DC

## 2016-03-12 MED ORDER — AZITHROMYCIN 250 MG PO TABS: ORAL_TABLET | ORAL | Status: DC

## 2016-03-12 MED ORDER — HYDROXYZINE PAMOATE 50 MG PO CAPS: 50.0000 mg | ORAL_CAPSULE | Freq: Every evening | ORAL | Status: AC | PRN

## 2016-03-12 MED ORDER — LEVOTHYROXINE SODIUM 200 MCG PO TABS: 200.0000 ug | ORAL_TABLET | Freq: Every day | ORAL | Status: AC

## 2016-03-12 MED ORDER — HYDROCODONE-HOMATROPINE 5-1.5 MG/5ML PO SYRP: 5.0000 mL | ORAL_SOLUTION | Freq: Four times a day (QID) | ORAL | Status: DC | PRN

## 2016-03-12 MED ORDER — CITALOPRAM HYDROBROMIDE 40 MG PO TABS: 40.0000 mg | ORAL_TABLET | Freq: Every day | ORAL | Status: AC
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
## 2016-03-16 ENCOUNTER — Telehealth: Payer: Self-pay

## 2016-03-16 NOTE — Telephone Encounter (Signed)
Pt was under the impression that she was prescribed prednisone for treatment of pneumonia///this is a contidiction of AVS and med list documentation/ predniSONE (DELTASONE) 20 MG tablet [[id number]][[id number]] // Pharmacy:  [facility] [id number] - [place], [place] - [address] [facility] [address]   Please call to consult [phone]

## 2016-03-17 MED ORDER — PREDNISONE 20 MG PO TABS: ORAL_TABLET | ORAL | Status: DC

## 2016-03-17 NOTE — Telephone Encounter (Signed)
Notified pt on VM that Rx was sent. 

## 2016-03-17 NOTE — Telephone Encounter (Signed)
Meds ordered this encounter  Medications  . predniSONE (DELTASONE) 20 MG tablet    Sig: 3/3/2/2/1/1 single daily dose for 6 days    Dispense:  12 tablet    Refill:  0   sciatica

## 2016-03-17 NOTE — Telephone Encounter (Signed)
Dr Merla Richesoolittle, I don't see anything in the OV notes about you planning to Rx pred for pt, nor a Dx of PNA (just lower resp inf). Did you intend to?

## 2016-04-03 ENCOUNTER — Encounter: Payer: Self-pay | Admitting: Internal Medicine

## 2016-04-04 ENCOUNTER — Encounter: Payer: Self-pay | Admitting: Internal Medicine

## 2016-04-04 MED ORDER — PHENYLEPH-PROMETHAZINE-COD 5-6.25-10 MG/5ML PO SYRP: 5.0000 mL | ORAL_SOLUTION | Freq: Four times a day (QID) | ORAL | Status: AC | PRN

## 2016-05-15 ENCOUNTER — Encounter: Payer: Self-pay | Admitting: Internal Medicine

## 2016-05-16 MED ORDER — ALPRAZOLAM 1 MG PO TABS: 0.5000 mg | ORAL_TABLET | Freq: Every day | ORAL | Status: DC | PRN

## 2016-05-16 NOTE — Telephone Encounter (Signed)
Called in Rx. Pt already notified on mychart.

## 2016-05-24 ENCOUNTER — Other Ambulatory Visit: Payer: Self-pay | Admitting: Internal Medicine

## 2016-06-15 ENCOUNTER — Other Ambulatory Visit: Payer: Self-pay | Admitting: Internal Medicine

## 2016-06-15 ENCOUNTER — Encounter: Payer: Self-pay | Admitting: Internal Medicine

## 2016-06-18 NOTE — Telephone Encounter (Signed)
Meds ordered this encounter  Medications  . ALPRAZolam (XANAX) 1 MG tablet    Sig: TAKE ONE TABLET BY MOUTH ONCE DAILY AS NEEDED FOR ANXIETY    Dispense:  30 tablet    Refill:  0   Phone in  Needs f/u with new provider before further refills

## 2016-06-23 ENCOUNTER — Other Ambulatory Visit: Payer: Self-pay | Admitting: Internal Medicine

## 2016-06-23 NOTE — Telephone Encounter (Signed)
Called in Rx. Pt aware, advised by operator that it was being done. Advised pt on Mychart of need for f/up.

## 2016-08-09 ENCOUNTER — Other Ambulatory Visit: Payer: Self-pay

## 2016-08-09 MED ORDER — ALPRAZOLAM 1 MG PO TABS: ORAL_TABLET | ORAL | 0 refills | Status: AC

## 2016-08-09 NOTE — Telephone Encounter (Signed)
Pharm reqs RF of alprazolam. Hailey Rowland pt last seen 02/2016, last RF 06/23/16

## 2016-08-09 NOTE — Telephone Encounter (Signed)
Called in Rx. Notified pt of RF and need to est care w/new provider for next refill. Pt agreed.

## 2016-08-09 NOTE — Telephone Encounter (Signed)
Meds ordered this encounter  Medications  . ALPRAZolam (XANAX) 1 MG tablet    Sig: TAKE ONE TABLET BY MOUTH ONCE DAILY AS NEEDED FOR ANXIETY    Dispense:  30 tablet    Refill:  0    Please advise this patient that she will need to establish for new PCP here or elsewhere for her next fill.
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
## 2016-08-23 ENCOUNTER — Other Ambulatory Visit: Payer: Self-pay

## 2016-08-23 MED ORDER — GABAPENTIN 300 MG PO CAPS: 300.0000 mg | ORAL_CAPSULE | Freq: Two times a day (BID) | ORAL | 0 refills | Status: AC

## 2016-09-07 ENCOUNTER — Other Ambulatory Visit: Payer: Self-pay | Admitting: Physician Assistant

## 2016-09-07 NOTE — Telephone Encounter (Signed)
Last RF 8/16, last OV w/Doolittle 3/19.

## 2016-09-08 NOTE — Telephone Encounter (Signed)
RTC

## 2016-12-19 ENCOUNTER — Other Ambulatory Visit: Payer: Self-pay | Admitting: Physician Assistant

## 2016-12-19 NOTE — Telephone Encounter (Signed)
Last OV 03/12/16 w/Doolittle, last RF 08/09/16.

## 2016-12-20 NOTE — Telephone Encounter (Signed)
Patient was advised she needs to return to the office and establish with a new PCP when she requested refills in both August and September.  Please advise her to RTC and establish with new PCP.

## 2016-12-21 NOTE — Telephone Encounter (Signed)
Pt advised And refused appointment at this time

## 2017-01-04 ENCOUNTER — Other Ambulatory Visit: Payer: Self-pay | Admitting: Physician Assistant

## 2017-01-18 ENCOUNTER — Telehealth: Payer: Self-pay

## 2017-01-18 NOTE — Telephone Encounter (Signed)
Pt calling in regards to status of refill on her bipolar medications I spoke with karen and confirmed that she has been told in December and august she would need to establish care with a new provider and be seen in order to have those medications refilled

## 2017-01-19 NOTE — Telephone Encounter (Signed)
Fyi, see prior refill notes too

## 2017-01-19 NOTE — Telephone Encounter (Signed)
Patient notified via My Chart. Needs to schedule visit.

## 2018-06-22 ENCOUNTER — Telehealth: Payer: Self-pay

## 2018-06-22 NOTE — Telephone Encounter (Signed)
Copied from CRM [phone]#[id number]. Topic: General - Other >> Jun 21, 2018  3:29 PM Gaynelle AduPoole, Shalonda wrote: Reason for CRM:   [facility]. The patient use to see Tonye Pearsonoolittle, Robert P, MD ( who is no longer with the practice). The best call back number is #[phone] and the Fax  #[phone]   [facility] will [facility] a release from the patient.   Please forward to Medical Records.

## 2018-09-09 DIAGNOSIS — R55 Syncope and collapse: Secondary | ICD-10-CM | POA: Diagnosis not present

## 2018-09-09 DIAGNOSIS — R42 Dizziness and giddiness: Secondary | ICD-10-CM | POA: Insufficient documentation

## 2018-09-09 DIAGNOSIS — F17290 Nicotine dependence, other tobacco product, uncomplicated: Secondary | ICD-10-CM | POA: Insufficient documentation

## 2018-09-09 DIAGNOSIS — Y9389 Activity, other specified: Secondary | ICD-10-CM | POA: Insufficient documentation

## 2018-09-09 DIAGNOSIS — W57XXXA Bitten or stung by nonvenomous insect and other nonvenomous arthropods, initial encounter: Secondary | ICD-10-CM | POA: Insufficient documentation

## 2018-09-09 DIAGNOSIS — Z79899 Other long term (current) drug therapy: Secondary | ICD-10-CM | POA: Diagnosis not present

## 2018-09-09 DIAGNOSIS — S20362A Insect bite (nonvenomous) of left front wall of thorax, initial encounter: Secondary | ICD-10-CM | POA: Insufficient documentation

## 2018-09-09 DIAGNOSIS — Y929 Unspecified place or not applicable: Secondary | ICD-10-CM | POA: Insufficient documentation

## 2018-09-09 DIAGNOSIS — E039 Hypothyroidism, unspecified: Secondary | ICD-10-CM | POA: Insufficient documentation

## 2018-09-09 DIAGNOSIS — Y998 Other external cause status: Secondary | ICD-10-CM | POA: Diagnosis not present

## 2018-09-10 ENCOUNTER — Encounter (HOSPITAL_COMMUNITY): Payer: Self-pay | Admitting: Emergency Medicine

## 2018-09-10 ENCOUNTER — Other Ambulatory Visit: Payer: Self-pay

## 2018-09-10 ENCOUNTER — Emergency Department (HOSPITAL_COMMUNITY)
Admission: EM | Admit: 2018-09-10 | Discharge: 2018-09-10 | Disposition: A | Discharge status: Home / Self Care | Payer: BLUE CROSS/BLUE SHIELD | Attending: Emergency Medicine | Admitting: Emergency Medicine

## 2018-09-10 DIAGNOSIS — W57XXXA Bitten or stung by nonvenomous insect and other nonvenomous arthropods, initial encounter: Secondary | ICD-10-CM

## 2018-09-10 DIAGNOSIS — I951 Orthostatic hypotension: Secondary | ICD-10-CM

## 2018-09-10 DIAGNOSIS — S20362A Insect bite (nonvenomous) of left front wall of thorax, initial encounter: Secondary | ICD-10-CM

## 2018-09-10 LAB — COMPREHENSIVE METABOLIC PANEL
ALBUMIN: 3.9 g/dL (ref 3.5–5.0)
ALK PHOS: 54 U/L (ref 38–126)
ALT: 16 U/L (ref 0–44)
AST: 30 U/L (ref 15–41)
Anion gap: 11 (ref 5–15)
BILIRUBIN TOTAL: 0.5 mg/dL (ref 0.3–1.2)
BUN: 6 mg/dL (ref 6–20)
CALCIUM: 9.2 mg/dL (ref 8.9–10.3)
CO2: 25 mmol/L (ref 22–32)
CREATININE: 0.79 mg/dL (ref 0.44–1.00)
Chloride: 106 mmol/L (ref 98–111)
GFR calc Af Amer: >60 mL/min (ref >60)
GFR calc non Af Amer: >60 mL/min (ref >60)
GLUCOSE: 118 mg/dL — AB (ref 70–99)
Potassium: 3.2 mmol/L — ABNORMAL LOW (ref 3.5–5.1)
SODIUM: 142 mmol/L (ref 135–145)
Total Protein: 7.2 g/dL (ref 6.5–8.1)

## 2018-09-10 LAB — CBC WITH DIFFERENTIAL/PLATELET
BASOS PCT: 1 %
Basophils Absolute: 0.1 10*3/uL (ref 0.0–0.1)
EOS ABS: 0.1 10*3/uL (ref 0.0–0.7)
Eosinophils Relative: 1 %
HEMATOCRIT: 41.6 % (ref 36.0–46.0)
HEMOGLOBIN: 14.1 g/dL (ref 12.0–15.0)
LYMPHS ABS: 2.8 10*3/uL (ref 0.7–4.0)
Lymphocytes Relative: 36 %
MCH: 31.5 pg (ref 26.0–34.0)
MCHC: 33.9 g/dL (ref 30.0–36.0)
MCV: 92.9 fL (ref 78.0–100.0)
Monocytes Absolute: 0.5 10*3/uL (ref 0.1–1.0)
Monocytes Relative: 6 %
NEUTROS ABS: 4.5 10*3/uL (ref 1.7–7.7)
Neutrophils Relative %: 56 %
Platelets: 237 10*3/uL (ref 150–400)
RBC: 4.48 MIL/uL (ref 3.87–5.11)
RDW: 13.2 % (ref 11.5–15.5)
WBC: 8 10*3/uL (ref 4.0–10.5)

## 2018-09-10 LAB — URINALYSIS, ROUTINE W REFLEX MICROSCOPIC
BILIRUBIN URINE: NEGATIVE
Glucose, UA: NEGATIVE mg/dL
HGB URINE DIPSTICK: NEGATIVE
KETONES UR: NEGATIVE mg/dL
Leukocytes, UA: NEGATIVE
Nitrite: NEGATIVE
PROTEIN: NEGATIVE mg/dL
Specific Gravity, Urine: 1.001 — ABNORMAL LOW (ref 1.005–1.030)
pH: 7 (ref 5.0–8.0)

## 2018-09-10 LAB — PREGNANCY, URINE: Preg Test, Ur: NEGATIVE

## 2018-09-10 NOTE — ED Triage Notes (Signed)
Pt reports getting bit by an insect on her left rib cage area several days ago. Slight red area <381mm noted.

## 2018-09-10 NOTE — ED Notes (Signed)
Called for triage x 1  No response 

## 2018-09-10 NOTE — Discharge Instructions (Addendum)
You may use over-the-counter hydrocortisone cream on your insect bite as needed for itching.

## 2018-09-10 NOTE — ED Provider Notes (Signed)
TIME SEEN: 4:39 AM  CHIEF COMPLAINT: Multiple complaints  HPI: Patient is a 27 year old female with history of hypothyroidism who presents to the emergency department with multiple complaints.  Patient states that she thinks that she was bit by a spider to the left thorax yesterday.  No known tick bite.  No fever.  Did not feel anything bite her.  States the area has been pruritic.  Has not tried any medication to this area.  No other insect bite.   Patient also complains of syncope.  States she feels lightheaded whenever she stands up quickly and had an episode where she passed out last night.  No preceding chest pain or shortness of breath.  No vomiting or diarrhea.  No bloody stools or melena.  No head or neck injury.   Patient also concerned that she may be pregnant.  No vaginal bleeding or discharge.  ROS: See HPI Constitutional: no fever  Eyes: no drainage  ENT: no runny nose   Cardiovascular:  no chest pain  Resp: no SOB  GI: no vomiting GU: no dysuria Integumentary: no rash  Allergy: no hives  Musculoskeletal: no leg swelling  Neurological: no slurred speech ROS otherwise negative  PAST MEDICAL HISTORY/PAST SURGICAL HISTORY:  Past Medical History:  Diagnosis Date  . Anxiety   . Headache syndrome   . HSV-2 (herpes simplex virus 2) infection   . Hypothyroidism   . Insomnia     MEDICATIONS:  Prior to Admission medications   Medication Sig Start Date End Date Taking? Authorizing Provider  ALPRAZolam Prudy Feeler) 1 MG tablet TAKE ONE TABLET BY MOUTH ONCE DAILY AS NEEDED FOR ANXIETY 08/09/16   Porfirio Oar, PA  azithromycin (ZITHROMAX) 250 MG tablet As packaged 03/12/16   Tonye Pearson, MD  citalopram (CELEXA) 40 MG tablet Take 1 tablet (40 mg total) by mouth daily. 03/12/16   Tonye Pearson, MD  clindamycin-benzoyl peroxide [facility]) gel Apply topically 2 (two) times daily. 06/03/13   Tonye Pearson, MD  clonazePAM (KLONOPIN) 1 MG tablet Take 1 tablet (1 mg  total) by mouth at bedtime. 02/03/16   Tonye Pearson, MD  cyclobenzaprine (FLEXERIL) 10 MG tablet Take 1 tablet (10 mg total) by mouth 2 (two) times daily as needed for muscle spasms. 12/31/15   Dowless, Lelon Mast Tripp, PA-C  dexlansoprazole (DEXILANT) 60 MG capsule Take 1 capsule (60 mg total) by mouth daily. 06/15/15   Louis Meckel, MD  diltiazem 2 % GEL Apply 1 application topically 3 (three) times daily. As directed Patient not taking: Reported on 03/12/2016 04/16/15   Meredith Pel, NP  gabapentin (NEURONTIN) 300 MG capsule Take 1 capsule (300 mg total) by mouth 2 (two) times daily. 08/23/16   Porfirio Oar, PA  HYDROcodone-homatropine (HYCODAN) 5-1.5 MG/5ML syrup Take 5 mLs by mouth every 6 (six) hours as needed. 03/12/16   Tonye Pearson, MD  hydrocortisone (ANUSOL-HC) 25 MG suppository Place 1 suppository (25 mg total) rectally at bedtime. For 10 days Patient not taking: Reported on 03/12/2016 04/16/15   Meredith Pel, NP  hydrOXYzine (VISTARIL) 50 MG capsule Take 1 capsule (50 mg total) by mouth at bedtime as needed. 03/12/16   Tonye Pearson, MD  ibuprofen (ADVIL,MOTRIN) 800 MG tablet Take 1 tablet (800 mg total) by mouth 3 (three) times daily with meals. 06/17/14   Mellody Drown, PA-C  levothyroxine (SYNTHROID, LEVOTHROID) 200 MCG tablet Take 1 tablet (200 mcg total) by mouth daily. 03/12/16   Tonye Pearson, MD  naproxen (NAPROSYN) 500 MG tablet Take 1 tablet (500 mg total) by mouth 2 (two) times daily. Patient not taking: Reported on 03/12/2016 12/31/15   Dowless, Lelon MastSamantha Tripp, PA-C  ondansetron (ZOFRAN) 4 MG tablet Take 1 tablet (4 mg total) by mouth every 8 (eight) hours as needed for nausea or vomiting. 04/05/15   Emi BelfastGessner, Deborah B, FNP  Phenyleph-Promethazine-Cod 5-6.25-10 MG/5ML SYRP Take 5 mLs by mouth every 6 (six) hours as needed. 04/04/16   Tonye Pearsonoolittle, Robert P, MD  predniSONE (DELTASONE) 20 MG tablet 3 tabs po day one, then 2 tabs daily x 4 days Patient not  taking: Reported on 03/12/2016 12/31/15   Dowless, Lester KinsmanSamantha Tripp, PA-C  predniSONE (DELTASONE) 20 MG tablet 3/3/2/2/1/1 single daily dose for 6 days 03/17/16   Tonye Pearsonoolittle, Robert P, MD  traZODone (DESYREL) 100 MG tablet TAKE ONE TABLET BY MOUTH AT BEDTIME 03/12/16   Tonye Pearsonoolittle, Robert P, MD  valACYclovir (VALTREX) 1000 MG tablet Take 1 tablet (1,000 mg total) by mouth 2 (two) times daily. Take at first onset of symptoms Patient not taking: Reported on 03/12/2016 09/30/13   Tonye Pearsonoolittle, Robert P, MD    ALLERGIES:  Allergies  Allergen Reactions  . Antihistamines, Chlorpheniramine-Type Other (See Comments)    "gets sicker"    SOCIAL HISTORY:  Social History   Tobacco Use  . Smoking status: Current Every Day Smoker    Types: E-cigarettes  . Smokeless tobacco: Never Used  Substance Use Topics  . Alcohol use: No    Alcohol/week: 0.0 standard drinks    FAMILY HISTORY: Family History  Problem Relation Age of Onset  . Colon cancer Neg Hx   . Esophageal cancer Neg Hx   . Rectal cancer Neg Hx   . Stomach cancer Neg Hx     EXAM: BP 124/90 (BP Location: Left Arm)   Pulse 77   Temp 98.4 F (36.9 C) (Oral)   Resp 16   Ht 5\' 3"  (1.6 m)   Wt 73.5 kg   LMP 08/26/2018   SpO2 99%   BMI 28.70 kg/m  CONSTITUTIONAL: Alert and oriented and responds appropriately to questions. Well-appearing; well-nourished HEAD: Normocephalic, atraumatic EYES: Conjunctivae clear, pupils appear equal, EOMI ENT: normal nose; moist mucous membranes NECK: Supple, no meningismus, no nuchal rigidity, no LAD  CARD: RRR; S1 and S2 appreciated; no murmurs, no clicks, no rubs, no gallops RESP: Normal chest excursion without splinting or tachypnea; breath sounds clear and equal bilaterally; no wheezes, no rhonchi, no rales, no hypoxia or respiratory distress, speaking full sentences ABD/GI: Normal bowel sounds; non-distended; soft, non-tender, no rebound, no guarding, no peritoneal signs, no hepatosplenomegaly BACK:  The  back appears normal and is non-tender to palpation, there is no CVA tenderness EXT: Normal ROM in all joints; non-tender to palpation; no edema; normal capillary refill; no cyanosis, no calf tenderness or swelling    SKIN: Normal color for age and race; warm; small erythematous papular lesion to the left thorax with small amount of surrounding redness with excoriations but no induration or fluctuance, no bull's-eye rash, no petechiae or purpura, no rash on her palms or mucous membranes NEURO: Moves all extremities equally PSYCH: The patient's mood and manner are appropriate. Grooming and personal hygiene are appropriate.  MEDICAL DECISION MAKING: Patient here with syncopal event.  Likely orthostatic in nature.  Labs unremarkable including normal electrolytes, hemoglobin.  Pregnancy test is negative.  EKG shows no ischemic abnormality, delta waves, interval changes.  Urine shows no sign of infection or dehydration today.  Encouraged increased oral fluid intake at home and standing for 30 to 60 seconds before walking and doing so slowly to prevent future syncopal events.  As for her insect bite, there is no sign of abscess or cellulitis.  She appears to have a localized reaction.  No recent tick bite and this is not a bull's-eye rash.  She does not need antibiotics.  She is allergic to antihistamines.  I recommended she use hydrocortisone over-the-counter as needed.  I feel patient is safe to be discharged home.  At this time, I do not feel there is any life-threatening condition present. I have reviewed and discussed all results (EKG, imaging, lab, urine as appropriate) and exam findings with patient/family. I have reviewed nursing notes and appropriate previous records.  I feel the patient is safe to be discharged home without further emergent workup and can continue workup as an outpatient as needed. Discussed usual and customary return precautions. Patient/family verbalize understanding and are comfortable  with this plan.  Outpatient follow-up has been provided if needed. All questions have been answered.      EKG Interpretation  Date/Time:  Tuesday September 10 2018 03:02:40 EDT Ventricular Rate:  87 PR Interval:    QRS Duration: 83 QT Interval:  362 QTC Calculation: 436 R Axis:   95 Text Interpretation:  Sinus rhythm Borderline right axis deviation No old tracing to compare Confirmed by Brittanee Ghazarian, Baxter Hire [id number]) on 09/10/2018 5:05:15 AM          Marlet Korte, Layla Maw, DO 09/10/18 2130

## 2018-09-10 NOTE — ED Notes (Signed)
Called x1 for vitals. No answer

## 2018-09-10 NOTE — ED Notes (Signed)
Bed: WTR9 Expected date:  Expected time:  Means of arrival:  Comments: 

## 2018-11-06 ENCOUNTER — Emergency Department (HOSPITAL_COMMUNITY): Payer: BLUE CROSS/BLUE SHIELD

## 2018-11-06 ENCOUNTER — Emergency Department (HOSPITAL_COMMUNITY)
Admission: EM | Admit: 2018-11-06 | Discharge: 2018-11-06 | Disposition: A | Discharge status: Home / Self Care | Payer: BLUE CROSS/BLUE SHIELD | Attending: Emergency Medicine | Admitting: Emergency Medicine

## 2018-11-06 ENCOUNTER — Encounter (HOSPITAL_COMMUNITY): Payer: Self-pay | Admitting: Student

## 2018-11-06 ENCOUNTER — Other Ambulatory Visit: Payer: Self-pay

## 2018-11-06 DIAGNOSIS — F17210 Nicotine dependence, cigarettes, uncomplicated: Secondary | ICD-10-CM | POA: Insufficient documentation

## 2018-11-06 DIAGNOSIS — F419 Anxiety disorder, unspecified: Secondary | ICD-10-CM | POA: Diagnosis not present

## 2018-11-06 DIAGNOSIS — J069 Acute upper respiratory infection, unspecified: Secondary | ICD-10-CM | POA: Diagnosis not present

## 2018-11-06 DIAGNOSIS — R0981 Nasal congestion: Secondary | ICD-10-CM | POA: Diagnosis present

## 2018-11-06 DIAGNOSIS — Z79899 Other long term (current) drug therapy: Secondary | ICD-10-CM | POA: Insufficient documentation

## 2018-11-06 HISTORY — DX: Unspecified asthma, uncomplicated: J45.909

## 2018-11-06 LAB — GROUP A STREP BY PCR: Group A Strep by PCR: NOT DETECTED

## 2018-11-06 MED ORDER — IBUPROFEN 800 MG PO TABS: 800.0000 mg | ORAL_TABLET | Freq: Three times a day (TID) | ORAL | 0 refills | Status: AC

## 2018-11-06 MED ORDER — FLUTICASONE PROPIONATE 50 MCG/ACT NA SUSP: 1.0000 | Freq: Every day | NASAL | 0 refills | Status: AC

## 2018-11-06 MED ORDER — BENZONATATE 100 MG PO CAPS: 100.0000 mg | ORAL_CAPSULE | Freq: Three times a day (TID) | ORAL | 0 refills | Status: AC

## 2018-11-06 MED ORDER — ALBUTEROL SULFATE HFA 108 (90 BASE) MCG/ACT IN AERS: 1.0000 | INHALATION_SPRAY | Freq: Four times a day (QID) | RESPIRATORY_TRACT | 0 refills | Status: AC | PRN

## 2018-11-06 NOTE — Discharge Instructions (Signed)
You were seen in the emergency today for upper respiratory symptoms, we suspect your symptoms are related to allergies or a virus at this time, your chest xray was normal and your strep test was negative.  I have prescribed you multiple medications to treat your symptoms.   -Flonase to be used 1 spray in each nostril daily.  This medication is used to treat your congestion.  -Tessalon can be taken once every 8 hours as needed.  This medication is used to treat your cough.  -Ibuprofen to be taken once every 8 hours as needed for pain. Please take this medicine with food as it can cause stomach upset and at worst stomach bleeding. Do not take other NSAIDs such as motrin, aleve, advil, naproxen, mobic, etc as they are similar. You make take tylenol per over the counter dosing with this medicine safely.\  - Albuterol inhaler- use 1-2 puffs as needed for wheezing/trouble breathing.   We have prescribed you new medication(s) today. Discuss the medications prescribed today with your pharmacist as they can have adverse effects and interactions with your other medicines including over the counter and prescribed medications. Seek medical evaluation if you start to experience new or abnormal symptoms after taking one of these medicines, seek care immediately if you start to experience difficulty breathing, feeling of your throat closing, facial swelling, or rash as these could be indications of a more serious allergic reaction  You will need to follow-up with your primary care provider in 1 week if your symptoms have not improved.  If you do not have a primary care provider one is provided in your discharge instructions.  Return to the emergency department for any new or worsening symptoms including but not limited to persistent fever for 5 days, difficulty breathing, chest pain, rashes, passing out, or any other concerns.

## 2018-11-06 NOTE — ED Triage Notes (Signed)
Pt reports a 2 week hx of productive cough, sinus drainage, chills and  Headache. Denies fever, denies NVD. Marland Kitchen. Last dosage of Tylenol this am. Last Robitussin this am. Posterior pharnx is not red or swollen. Moist, nonproductive cough noted. Faint end expiratory wheeze noted

## 2018-11-06 NOTE — ED Provider Notes (Signed)
[facility]-EMERGENCY DEPT Provider Note   CSN: [id number] Arrival date & time: 11/06/18  1145     History   Chief Complaint Chief Complaint  Patient presents with  . Cough  . Sore Throat    HPI Hailey Rowland is a 27 y.o. female with a hx of anxiety, hypothyroidism, asthma, and prior adenoidectomy who presents to the ED with complaints of URI sxs for the past 2 weeks. Patient reports she has been experiencing congestion, rhinorrhea, sore throat, L ear pain,  intermittently productive cough with yellow mucous sputum, and subjective fevers. She states she occasionally thinks she is wheezing, but does not have an inhaler at home to use. She has tried robitussin and tylenol without significant relief. No other specific alleviating/aggravating factors. Denies chest pain, dyspnea, nausea, vomiting, or diarrhea. Denies chance of pregnancy.   HPI  Past Medical History:  Diagnosis Date  . Anxiety   . Headache syndrome   . HSV-2 (herpes simplex virus 2) infection   . Hypothyroidism   . Insomnia     Patient Active Problem List   Diagnosis Date Noted  . GERD (gastroesophageal reflux disease) 04/16/2015  . Nausea with vomiting 04/16/2015  . Altered bowel habits 04/16/2015  . Internal hemorrhoids 04/16/2015  . Anal fissure 04/16/2015  . Other headache syndrome 04/12/2015  . Chronic lumbar pain 04/12/2015  . GAD (generalized anxiety disorder) 03/15/2015  . Insomnia 03/15/2015  . 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 Unspecified hypothyroidism 06/05/2013    Past Surgical History:  Procedure Laterality Date  . ADENOIDECTOMY    . MYRINGOTOMY       OB History   None      Home Medications    Prior to Admission medications   Medication Sig Start Date End Date Taking? Authorizing Provider  ALPRAZolam Prudy Feeler) 1 MG tablet TAKE ONE TABLET BY MOUTH ONCE DAILY AS NEEDED FOR ANXIETY 08/09/16   Porfirio Oar, PA  azithromycin  (ZITHROMAX) 250 MG tablet As packaged 03/12/16   Tonye Pearson, MD  citalopram (CELEXA) 40 MG tablet Take 1 tablet (40 mg total) by mouth daily. 03/12/16   Tonye Pearson, MD  clindamycin-benzoyl peroxide [facility]) gel Apply topically 2 (two) times daily. 06/03/13   Tonye Pearson, MD  clonazePAM (KLONOPIN) 1 MG tablet Take 1 tablet (1 mg total) by mouth at bedtime. 02/03/16   Tonye Pearson, MD  cyclobenzaprine (FLEXERIL) 10 MG tablet Take 1 tablet (10 mg total) by mouth 2 (two) times daily as needed for muscle spasms. 12/31/15   Dowless, Lelon Mast Tripp, PA-C  dexlansoprazole (DEXILANT) 60 MG capsule Take 1 capsule (60 mg total) by mouth daily. 06/15/15   Louis Meckel, MD  diltiazem 2 % GEL Apply 1 application topically 3 (three) times daily. As directed Patient not taking: Reported on 03/12/2016 04/16/15   Meredith Pel, NP  gabapentin (NEURONTIN) 300 MG capsule Take 1 capsule (300 mg total) by mouth 2 (two) times daily. 08/23/16   Porfirio Oar, PA  HYDROcodone-homatropine (HYCODAN) 5-1.5 MG/5ML syrup Take 5 mLs by mouth every 6 (six) hours as needed. 03/12/16   Tonye Pearson, MD  hydrocortisone (ANUSOL-HC) 25 MG suppository Place 1 suppository (25 mg total) rectally at bedtime. For 10 days Patient not taking: Reported on 03/12/2016 04/16/15   Meredith Pel, NP  hydrOXYzine (VISTARIL) 50 MG capsule Take 1 capsule (50 mg total) by mouth at bedtime as needed. 03/12/16   Tonye Pearson, MD  ibuprofen (ADVIL,MOTRIN) 800 MG tablet Take 1 tablet (800 mg total) by mouth 3 (three) times daily with meals. 06/17/14   Mellody Drown, PA-C  levothyroxine (SYNTHROID, LEVOTHROID) 200 MCG tablet Take 1 tablet (200 mcg total) by mouth daily. 03/12/16   Tonye Pearson, MD  naproxen (NAPROSYN) 500 MG tablet Take 1 tablet (500 mg total) by mouth 2 (two) times daily. Patient not taking: Reported on 03/12/2016 12/31/15   Dowless, Lelon Mast Tripp, PA-C  ondansetron (ZOFRAN) 4 MG tablet  Take 1 tablet (4 mg total) by mouth every 8 (eight) hours as needed for nausea or vomiting. 04/05/15   Emi Belfast, FNP  Phenyleph-Promethazine-Cod 5-6.25-10 MG/5ML SYRP Take 5 mLs by mouth every 6 (six) hours as needed. 04/04/16   Tonye Pearson, MD  predniSONE (DELTASONE) 20 MG tablet 3 tabs po day one, then 2 tabs daily x 4 days Patient not taking: Reported on 03/12/2016 12/31/15   Dowless, Lester Kinsman, PA-C  predniSONE (DELTASONE) 20 MG tablet 3/3/2/2/1/1 single daily dose for 6 days 03/17/16   Tonye Pearson, MD  traZODone (DESYREL) 100 MG tablet TAKE ONE TABLET BY MOUTH AT BEDTIME 03/12/16   Tonye Pearson, MD  valACYclovir (VALTREX) 1000 MG tablet Take 1 tablet (1,000 mg total) by mouth 2 (two) times daily. Take at first onset of symptoms Patient not taking: Reported on 03/12/2016 09/30/13   Tonye Pearson, MD    Family History Family History  Problem Relation Age of Onset  . Colon cancer Neg Hx   . Esophageal cancer Neg Hx   . Rectal cancer Neg Hx   . Stomach cancer Neg Hx     Social History Social History   Tobacco Use  . Smoking status: Current Every Day Smoker    Types: E-cigarettes  . Smokeless tobacco: Never Used  Substance Use Topics  . Alcohol use: No    Alcohol/week: 0.0 standard drinks  . Drug use: No     Allergies   Antihistamines, chlorpheniramine-type   Review of Systems Review of Systems  Constitutional: Positive for fever (subjective).  HENT: Positive for congestion, ear pain, rhinorrhea and sore throat.   Respiratory: Positive for cough and wheezing. Negative for shortness of breath.   Cardiovascular: Negative for chest pain.  Gastrointestinal: Negative for abdominal pain, diarrhea, nausea and vomiting.     Physical Exam Updated Vital Signs BP 131/81 (BP Location: Right Arm)   Pulse 87   Temp 97.8 F (36.6 C) (Oral)   Resp 18   SpO2 99%   Physical Exam  Constitutional: She appears well-developed and well-nourished.  No distress.  HENT:  Head: Normocephalic and atraumatic.  Right Ear: No drainage or swelling. Tympanic membrane is not perforated, not erythematous, not retracted and not bulging.  Left Ear: No drainage or swelling. Tympanic membrane is not perforated, not erythematous, not retracted and not bulging.  Nose: Mucosal edema present. Right sinus exhibits no maxillary sinus tenderness and no frontal sinus tenderness. Left sinus exhibits no maxillary sinus tenderness and no frontal sinus tenderness.  Mouth/Throat: Uvula is midline. Posterior oropharyngeal erythema present. No oropharyngeal exudate.  Posterior oropharynx is symmetric appearing. Patient tolerating own secretions without difficulty. No trismus. No drooling. No hot potato voice. No swelling beneath the tongue, submandibular compartment is soft.   Eyes: Pupils are equal, round, and reactive to light. Conjunctivae are normal. Right eye exhibits no discharge. Left eye exhibits no discharge.  Neck: Normal range of motion. Neck supple. No neck rigidity. No erythema present.  Cardiovascular: Normal rate and regular rhythm.  No murmur heard. Pulmonary/Chest: Breath sounds normal. No respiratory distress. She has no wheezes. She has no rales.  Abdominal: Soft. She exhibits no distension. There is no tenderness.  Lymphadenopathy:    She has no cervical adenopathy.  Neurological: She is alert.  Skin: Skin is warm and dry. No rash noted.  Psychiatric: She has a normal mood and affect. Her behavior is normal.  Nursing note and vitals reviewed.    ED Treatments / Results  Labs (all labs ordered are listed, but only abnormal results are displayed) Labs Reviewed  GROUP A STREP BY PCR    EKG None  Radiology Dg Chest 2 View  Result Date: 11/06/2018 CLINICAL DATA:  Cough and sinus drainage EXAM: CHEST - 2 VIEW COMPARISON:  June 17, 2014 FINDINGS: No edema or consolidation. Heart size and pulmonary vascularity are normal. No adenopathy. No  bone lesions. IMPRESSION: No edema or consolidation. Electronically Signed   By: Bretta BangWilliam  Woodruff III M.D.   On: 11/06/2018 13:18    Procedures Procedures (including critical care time)  Medications Ordered in ED Medications - No data to display   Initial Impression / Assessment and Plan / ED Course  I have reviewed the triage vital signs and the nursing notes.  Pertinent labs & imaging results that were available during my care of the patient were reviewed by me and considered in my medical decision making (see chart for details).    Patient presents with URI type symptoms.  Patient is nontoxic appearing, in no apparent distress, vitals are WNL. Patient is afebrile in the ED, lungs are CTA, CXR negative for infiltrate, doubt pneumonia. There is no wheezing or signs of respiratory distress. Patient is afebrile, no sinus tenderness, doubt acute bacterial sinusitis. Strep negative, exam non consistent with RPA/PTA. No evidence of AOM on exam. No meningeal signs.  Suspect viral vs allergic etiology at this time and will treat supportively with Ibuprofen, Flonase, and Tessalon.  Will also provide inhaler, no wheezing on my exam, but reported w/ hx of asthma, do not feel steroids are indicated for a true asthma exacerbation. I discussed results, treatment plan, need for PCP follow-up, and return precautions with the patient. Provided opportunity for questions, patient confirmed understanding and is in agreement with plan.   Final Clinical Impressions(s) / ED Diagnoses   Final diagnoses:  URI with cough and congestion    ED Discharge Orders         Ordered    fluticasone (FLONASE) 50 MCG/ACT nasal spray  Daily     11/06/18 1335    benzonatate (TESSALON) 100 MG capsule  Every 8 hours     11/06/18 1335    ibuprofen (ADVIL,MOTRIN) 800 MG tablet  3 times daily     11/06/18 1335    albuterol (PROVENTIL HFA;VENTOLIN HFA) 108 (90 Base) MCG/ACT inhaler  Every 6 hours PRN     11/06/18 1335            Sachi Boulay, [place] Fork[place]antha R, PA-C 11/06/18 1403    Jacalyn LefevreHaviland, Julie, MD 11/06/18 1501

## 2019-04-23 ENCOUNTER — Other Ambulatory Visit: Payer: Self-pay

## 2019-04-23 ENCOUNTER — Emergency Department (HOSPITAL_COMMUNITY)
Admission: EM | Admit: 2019-04-23 | Discharge: 2019-04-24 | Disposition: A | Discharge status: Home / Self Care | Payer: BLUE CROSS/BLUE SHIELD | Attending: Emergency Medicine | Admitting: Emergency Medicine

## 2019-04-23 DIAGNOSIS — Z5321 Procedure and treatment not carried out due to patient leaving prior to being seen by health care provider: Secondary | ICD-10-CM | POA: Insufficient documentation

## 2019-04-23 DIAGNOSIS — R531 Weakness: Secondary | ICD-10-CM | POA: Diagnosis present

## 2019-11-29 ENCOUNTER — Other Ambulatory Visit: Payer: Self-pay

## 2019-11-29 ENCOUNTER — Ambulatory Visit (HOSPITAL_COMMUNITY)
Admission: EM | Admit: 2019-11-29 | Discharge: 2019-11-29 | Disposition: A | Discharge status: Home / Self Care | Payer: BLUE CROSS/BLUE SHIELD | Attending: Family Medicine | Admitting: Family Medicine

## 2019-11-29 ENCOUNTER — Encounter (HOSPITAL_COMMUNITY): Payer: Self-pay

## 2019-11-29 DIAGNOSIS — K0889 Other specified disorders of teeth and supporting structures: Secondary | ICD-10-CM | POA: Diagnosis not present

## 2019-11-29 MED ORDER — HYDROCODONE-ACETAMINOPHEN 5-325 MG PO TABS: 1.0000 | ORAL_TABLET | Freq: Four times a day (QID) | ORAL | 0 refills | Status: DC | PRN

## 2019-11-29 MED ORDER — PENICILLIN V POTASSIUM 500 MG PO TABS: 500.0000 mg | ORAL_TABLET | Freq: Three times a day (TID) | ORAL | 0 refills | Status: DC

## 2019-11-29 NOTE — ED Notes (Signed)
No answer

## 2019-11-29 NOTE — Discharge Instructions (Signed)
Be aware, pain medications may cause drowsiness. Please do not drive, operate heavy machinery or make important decisions while on this medication, it can cloud your judgement.  

## 2019-11-29 NOTE — ED Triage Notes (Signed)
Pt present abscess on the left side upper tooth. Pt states  Its a pus pocket and she is in severe pain.

## 2019-12-01 NOTE — ED Provider Notes (Signed)
[facility]   [id number] 11/29/19 Arrival Time: 1524  ASSESSMENT & PLAN:  1. Pain, dental     No sign of abscess requiring I&D at this time. Discussed.  Begin: Meds ordered this encounter  Medications  . penicillin v potassium (VEETID) 500 MG tablet    Sig: Take 1 tablet (500 mg total) by mouth 3 (three) times daily.    Dispense:  30 tablet    Refill:  0  . HYDROcodone-acetaminophen (NORCO/VICODIN) 5-325 MG tablet    Sig: Take 1 tablet by mouth every 6 (six) hours as needed for moderate pain or severe pain.    Dispense:  8 tablet    Refill:  0    [place] Controlled Substances Registry consulted for this patient. I feel the risk/benefit ratio today is favorable for proceeding with this prescription for a controlled substance. Medication sedation precautions given.  Dental resource written instructions given. She will schedule dental evaluation as soon as possible if not improving over the next 24-48 hours.  Reviewed expectations re: course of current medical issues. Questions answered. Outlined signs and symptoms indicating need for more acute intervention. Patient verbalized understanding. After Visit Summary given.   SUBJECTIVE:  Hailey Rowland is a 28 y.o. female who reports gradual onset of left upper dental pain described as aching/throbbing. Present for several days. Fever: absent. Tolerating PO intake but reports pain with chewing. Normal swallowing. She does not see a dentist regularly. No neck swelling or pain. OTC analgesics without relief. No specific alleviating factors. Pain is limiting her daily activities and affecting sleep.  ROS: As per HPI. All other systems negative.   OBJECTIVE: Vitals:   11/29/19 1633  BP: 130/87  Pulse: 73  Resp: 16  Temp: 98.3 F (36.8 C)  TempSrc: Oral  SpO2: 100%    General appearance: alert; no distress HENT: normocephalic; atraumatic; dentition: fair; left upper gums without areas of fluctuance, drainage, or  bleeding and with tenderness to palpation; normal jaw movement without difficulty Neck: supple without LAD; FROM; trachea midline CV: RRR without murmer Lungs: normal respirations; unlabored Ext: no edema Skin: warm and dry Neuro: normal gait; normal upper extremity sensation and strength Psychological: alert and cooperative; normal mood and affect  Allergies  Allergen Reactions  . Antihistamines, Chlorpheniramine-Type Other (See Comments)    "gets sicker"    Past Medical History:  Diagnosis Date  . Anxiety   . Asthma   . Headache syndrome   . HSV-2 (herpes simplex virus 2) infection   . Hypothyroidism   . Insomnia    Social History   Socioeconomic History  . Marital status: Single    Spouse name: Not on file  . Number of children: Not on file  . Years of education: Not on file  . Highest education level: Not on file  Occupational History  . Not on file  Social Needs  . Financial resource strain: Not on file  . Food insecurity    Worry: Not on file    Inability: Not on file  . Transportation needs    Medical: Not on file    Non-medical: Not on file  Tobacco Use  . Smoking status: Current Every Day Smoker    Types: E-cigarettes  . Smokeless tobacco: Never Used  Substance and Sexual Activity  . Alcohol use: Yes    Alcohol/week: 0.0 standard drinks    Comment: occ  . Drug use: No  . Sexual activity: Yes    Birth control/protection: Condom  Lifestyle  .  Physical activity    Days per week: Not on file    Minutes per session: Not on file  . Stress: Not on file  Relationships  . Social Herbalist on phone: Not on file    Gets together: Not on file    Attends religious service: Not on file    Active member of club or organization: Not on file    Attends meetings of clubs or organizations: Not on file    Relationship status: Not on file  . Intimate partner violence    Fear of current or ex partner: Not on file    Emotionally abused: Not on file     Physically abused: Not on file    Forced sexual activity: Not on file  Other Topics Concern  . Not on file  Social History Narrative   Marital: single; dating steady boyfriend x 4 months      Children: none      Lives: with mom and dad      Employment:  Unemployed; graduated from high school in 2012      Tobacco   Family History  Problem Relation Age of Onset  . Diabetes Mother   . Heart failure Mother   . Hypertension Father   . Hyperlipidemia Father   . Colon cancer Neg Hx   . Esophageal cancer Neg Hx   . Rectal cancer Neg Hx   . Stomach cancer Neg Hx    Past Surgical History:  Procedure Laterality Date  . ADENOIDECTOMY    . Fransisco Beau, MD 12/01/19 [phone]

## 2020-01-26 ENCOUNTER — Emergency Department (HOSPITAL_COMMUNITY): Payer: Self-pay

## 2020-01-26 ENCOUNTER — Encounter (HOSPITAL_COMMUNITY): Payer: Self-pay

## 2020-01-26 ENCOUNTER — Emergency Department (HOSPITAL_COMMUNITY)
Admission: EM | Admit: 2020-01-26 | Discharge: 2020-01-27 | Disposition: A | Discharge status: Home / Self Care | Payer: Self-pay | Attending: Emergency Medicine | Admitting: Emergency Medicine

## 2020-01-26 DIAGNOSIS — R1012 Left upper quadrant pain: Secondary | ICD-10-CM | POA: Insufficient documentation

## 2020-01-26 DIAGNOSIS — F17290 Nicotine dependence, other tobacco product, uncomplicated: Secondary | ICD-10-CM | POA: Insufficient documentation

## 2020-01-26 DIAGNOSIS — K219 Gastro-esophageal reflux disease without esophagitis: Secondary | ICD-10-CM | POA: Insufficient documentation

## 2020-01-26 DIAGNOSIS — Z20822 Contact with and (suspected) exposure to covid-19: Secondary | ICD-10-CM | POA: Insufficient documentation

## 2020-01-26 DIAGNOSIS — R0789 Other chest pain: Secondary | ICD-10-CM | POA: Insufficient documentation

## 2020-01-26 LAB — BASIC METABOLIC PANEL
Anion gap: 8 (ref 5–15)
BUN: 5 mg/dL — ABNORMAL LOW (ref 6–20)
CO2: 24 mmol/L (ref 22–32)
Calcium: 9.2 mg/dL (ref 8.9–10.3)
Chloride: 108 mmol/L (ref 98–111)
Creatinine, Ser: 0.73 mg/dL (ref 0.44–1.00)
GFR calc Af Amer: >60 mL/min (ref >60)
GFR calc non Af Amer: >60 mL/min (ref >60)
Glucose, Bld: 101 mg/dL — ABNORMAL HIGH (ref 70–99)
Potassium: 3.9 mmol/L (ref 3.5–5.1)
Sodium: 140 mmol/L (ref 135–145)

## 2020-01-26 LAB — CBC
HCT: 40.6 % (ref 36.0–46.0)
Hemoglobin: 13.6 g/dL (ref 12.0–15.0)
MCH: 31.1 pg (ref 26.0–34.0)
MCHC: 33.5 g/dL (ref 30.0–36.0)
MCV: 92.7 fL (ref 80.0–100.0)
Platelets: 199 K/uL (ref 150–400)
RBC: 4.38 MIL/uL (ref 3.87–5.11)
RDW: 11.2 % — ABNORMAL LOW (ref 11.5–15.5)
WBC: 8.3 K/uL (ref 4.0–10.5)
nRBC: 0 % (ref 0.0–0.2)

## 2020-01-26 LAB — TROPONIN I (HIGH SENSITIVITY)
Troponin I (High Sensitivity): <2 ng/L (ref <18)
Troponin I (High Sensitivity): <2 ng/L (ref <18)

## 2020-01-26 LAB — URINALYSIS, ROUTINE W REFLEX MICROSCOPIC
Bilirubin Urine: NEGATIVE
Glucose, UA: NEGATIVE mg/dL
Hgb urine dipstick: NEGATIVE
Ketones, ur: NEGATIVE mg/dL
Leukocytes,Ua: NEGATIVE
Nitrite: NEGATIVE
Protein, ur: NEGATIVE mg/dL
Specific Gravity, Urine: 1.02 (ref 1.005–1.030)
pH: 7 (ref 5.0–8.0)

## 2020-01-26 LAB — I-STAT BETA HCG BLOOD, ED (MC, WL, AP ONLY): I-stat hCG, quantitative: <5 m[IU]/mL (ref <5)

## 2020-01-26 LAB — POC URINE PREG, ED: Preg Test, Ur: NEGATIVE

## 2020-01-26 MED ORDER — MORPHINE SULFATE (PF) 4 MG/ML IV SOLN: 4.0000 mg | Freq: Once | INTRAVENOUS | Status: DC

## 2020-01-26 MED ORDER — IOHEXOL 300 MG/ML  SOLN
100.0000 mL | Freq: Once | INTRAMUSCULAR | Status: AC | PRN
  Administered 2020-01-26: 100 mL via INTRAVENOUS

## 2020-01-26 MED ORDER — KETOROLAC TROMETHAMINE 30 MG/ML IJ SOLN
15.0000 mg | Freq: Once | INTRAMUSCULAR | Status: AC
  Administered 2020-01-26: 15 mg via INTRAVENOUS
  Filled 2020-01-26: qty 1

## 2020-01-26 MED ORDER — HYDROCODONE-ACETAMINOPHEN 5-325 MG PO TABS: 1.0000 | ORAL_TABLET | Freq: Four times a day (QID) | ORAL | 0 refills | Status: AC | PRN

## 2020-01-26 MED ORDER — METHOCARBAMOL 500 MG PO TABS: 500.0000 mg | ORAL_TABLET | Freq: Two times a day (BID) | ORAL | 0 refills | Status: AC

## 2020-01-26 MED ORDER — OXYCODONE-ACETAMINOPHEN 5-325 MG PO TABS
1.0000 | ORAL_TABLET | Freq: Once | ORAL | Status: AC
  Administered 2020-01-26: 1 via ORAL
  Filled 2020-01-26: qty 1

## 2020-01-26 MED ORDER — DICLOFENAC SODIUM 1 % EX GEL: 4.0000 g | Freq: Four times a day (QID) | CUTANEOUS | 2 refills | Status: AC

## 2020-01-26 MED ORDER — METHOCARBAMOL 500 MG PO TABS
500.0000 mg | ORAL_TABLET | Freq: Once | ORAL | Status: AC
  Administered 2020-01-26: 500 mg via ORAL
  Filled 2020-01-26: qty 1

## 2020-01-26 MED ORDER — ONDANSETRON HCL 4 MG/2ML IJ SOLN
4.0000 mg | Freq: Once | INTRAMUSCULAR | Status: AC
  Administered 2020-01-26: 4 mg via INTRAVENOUS
  Filled 2020-01-26: qty 2

## 2020-01-26 MED ORDER — SODIUM CHLORIDE 0.9 % IV BOLUS
1000.0000 mL | Freq: Once | INTRAVENOUS | Status: AC
  Administered 2020-01-26: 1000 mL via INTRAVENOUS

## 2020-01-26 MED ORDER — LIDOCAINE 5 % EX PTCH: 1.0000 | MEDICATED_PATCH | CUTANEOUS | 0 refills | Status: AC

## 2020-01-26 NOTE — ED Triage Notes (Signed)
Pt. Stated, Im walking outside to give my fiancee some money.

## 2020-01-26 NOTE — ED Triage Notes (Signed)
Pt from home with left sided chst pain for 3 weeks, worsening today. Chronic cough, no known COVID contacts. Pt a.o

## 2020-01-26 NOTE — ED Provider Notes (Signed)
[facility] EMERGENCY DEPARTMENT Provider Note   CSN: [id number] Arrival date & time: 01/26/20  1505     History Chief Complaint  Patient presents with  . Chest Pain    TIMBERLEE ROBLERO is a 29 y.o. female.  HPI      TOTIANA EVERSON is a 29 y.o. female, with a history of anxiety, asthma, hyperthyroidism, presenting to the ED with pain to the left side for the past 3 weeks.  Initially, pain was intermittent, but for the last several days has been constant.  Pain is sharp, noted in the left mid back, left flank, and left lower chest and left upper quadrant of the abdomen.  Pain is currently 10/10, worse with palpation or movement.  She has not had this pain before.  Occasional nausea. She has intermittently tried ibuprofen for her pain without resolution.  She does note she has a physically intense job and moved into a different role in this job when the pain started. Denies history of PE/DVT, recent surgery, recent trauma, recent immobilization.  Denies fever/chills, acute cough, other chest pain, shortness of breath, other abdominal pain, urinary symptoms, dizziness, syncope, lower extremity edema/pain, /V/C/D, or any other complaints.   Past Medical History:  Diagnosis Date  . Anxiety   . Asthma   . Headache syndrome   . HSV-2 (herpes simplex virus 2) infection   . Hypothyroidism   . Insomnia     Patient Active Problem List   Diagnosis Date Noted  . GERD (gastroesophageal reflux disease) 04/16/2015  . Nausea with vomiting 04/16/2015  . Altered bowel habits 04/16/2015  . Internal hemorrhoids 04/16/2015  . Anal fissure 04/16/2015  . Other headache syndrome 04/12/2015  . Chronic lumbar pain 04/12/2015  . GAD (generalized anxiety disorder) 03/15/2015  . Insomnia 03/15/2015  . 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 Unspecified hypothyroidism 06/05/2013    Past Surgical History:  Procedure Laterality Date  . ADENOIDECTOMY    .  MYRINGOTOMY       OB History   No obstetric history on file.     Family History  Problem Relation Age of Onset  . Diabetes Mother   . Heart failure Mother   . Hypertension Father   . Hyperlipidemia Father   . Colon cancer Neg Hx   . Esophageal cancer Neg Hx   . Rectal cancer Neg Hx   . Stomach cancer Neg Hx     Social History   Tobacco Use  . Smoking status: Current Every Day Smoker    Types: E-cigarettes  . Smokeless tobacco: Never Used  Substance Use Topics  . Alcohol use: Yes    Alcohol/week: 0.0 standard drinks    Comment: occ  . Drug use: No    Home Medications Prior to Admission medications   Medication Sig Start Date End Date Taking? Authorizing Provider  albuterol (PROVENTIL HFA;VENTOLIN HFA) 108 (90 Base) MCG/ACT inhaler Inhale 1-2 puffs into the lungs every 6 (six) hours as needed for wheezing or shortness of breath. 11/06/18  Yes Petrucelli, Samantha R, PA-C  levothyroxine (SYNTHROID, LEVOTHROID) 200 MCG tablet Take 1 tablet (200 mcg total) by mouth daily. 03/12/16  Yes Tonye Pearson, MD  ALPRAZolam Prudy Feeler) 1 MG tablet TAKE ONE TABLET BY MOUTH ONCE DAILY AS NEEDED FOR ANXIETY Patient not taking: Reported on 01/26/2020 08/09/16   Porfirio Oar, PA  benzonatate (TESSALON) 100 MG capsule Take 1 capsule (100 mg total) by mouth every 8 (eight) hours. Patient  not taking: Reported on 01/26/2020 11/06/18   Petrucelli, Aldona Bar R, PA-C  citalopram (CELEXA) 40 MG tablet Take 1 tablet (40 mg total) by mouth daily. Patient not taking: Reported on 01/26/2020 03/12/16   Leandrew Koyanagi, MD  clindamycin-benzoyl peroxide [facility]) gel Apply topically 2 (two) times daily. Patient not taking: Reported on 01/26/2020 06/03/13   Leandrew Koyanagi, MD  clonazePAM (KLONOPIN) 1 MG tablet Take 1 tablet (1 mg total) by mouth at bedtime. Patient not taking: Reported on 01/26/2020 02/03/16   Leandrew Koyanagi, MD  dexlansoprazole (DEXILANT) 60 MG capsule Take 1 capsule (60 mg total)  by mouth daily. Patient not taking: Reported on 01/26/2020 06/15/15   Inda Castle, MD  diclofenac Sodium (VOLTAREN) 1 % GEL Apply 4 g topically 4 (four) times daily. 01/26/20   Corina Stacy C, PA-C  fluticasone (FLONASE) 50 MCG/ACT nasal spray Place 1 spray into both nostrils daily. Patient not taking: Reported on 01/26/2020 11/06/18   Petrucelli, Samantha R, PA-C  gabapentin (NEURONTIN) 300 MG capsule Take 1 capsule (300 mg total) by mouth 2 (two) times daily. Patient not taking: Reported on 01/26/2020 08/23/16   Harrison Mons, PA  HYDROcodone-acetaminophen (NORCO/VICODIN) 5-325 MG tablet Take 1 tablet by mouth every 6 (six) hours as needed for severe pain. 01/26/20   Maizy Davanzo C, PA-C  hydrOXYzine (VISTARIL) 50 MG capsule Take 1 capsule (50 mg total) by mouth at bedtime as needed. Patient not taking: Reported on 01/26/2020 03/12/16   Leandrew Koyanagi, MD  ibuprofen (ADVIL,MOTRIN) 800 MG tablet Take 1 tablet (800 mg total) by mouth 3 (three) times daily. Patient not taking: Reported on 01/26/2020 11/06/18   Petrucelli, Samantha R, PA-C  lidocaine (LIDODERM) 5 % Place 1 patch onto the skin daily. Remove & Discard patch within 12 hours or as directed by MD 01/26/20   Anistyn Graddy C, PA-C  methocarbamol (ROBAXIN) 500 MG tablet Take 1 tablet (500 mg total) by mouth 2 (two) times daily. 01/26/20   Terez Freimark C, PA-C  ondansetron (ZOFRAN) 4 MG tablet Take 1 tablet (4 mg total) by mouth every 8 (eight) hours as needed for nausea or vomiting. Patient not taking: Reported on 01/26/2020 04/05/15   Elby Beck, FNP  Phenyleph-Promethazine-Cod 5-6.25-10 MG/5ML SYRP Take 5 mLs by mouth every 6 (six) hours as needed. Patient not taking: Reported on 01/26/2020 04/04/16   Leandrew Koyanagi, MD  traZODone (DESYREL) 100 MG tablet TAKE ONE TABLET BY MOUTH AT BEDTIME Patient not taking: Reported on 01/26/2020 03/12/16   Leandrew Koyanagi, MD    Allergies    Antihistamines, chlorpheniramine-type  Review of Systems     Review of Systems  Constitutional: Negative for chills, diaphoresis and fever.  Respiratory: Negative for cough and shortness of breath.   Cardiovascular: Negative for leg swelling.  Gastrointestinal: Positive for abdominal pain. Negative for nausea and vomiting.  Genitourinary: Positive for flank pain. Negative for dysuria, frequency and hematuria.  Musculoskeletal: Positive for back pain.  Neurological: Negative for dizziness, syncope, weakness, light-headedness and numbness.  All other systems reviewed and are negative.   Physical Exam Updated Vital Signs BP 113/79   Pulse 87   Temp 98.3 F (36.8 C) (Oral)   Resp 20   SpO2 100%   Physical Exam Vitals and nursing note reviewed.  Constitutional:      General: She is in acute distress (pain).     Appearance: She is well-developed. She is not diaphoretic.  HENT:     Head:  Normocephalic and atraumatic.     Mouth/Throat:     Mouth: Mucous membranes are moist.     Pharynx: Oropharynx is clear.  Eyes:     Conjunctiva/sclera: Conjunctivae normal.  Cardiovascular:     Rate and Rhythm: Normal rate and regular rhythm.     Pulses: Normal pulses.          Radial pulses are 2+ on the right side and 2+ on the left side.       Posterior tibial pulses are 2+ on the right side and 2+ on the left side.     Heart sounds: Normal heart sounds.     Comments: Tactile temperature in the extremities appropriate and equal bilaterally. Pulmonary:     Effort: Pulmonary effort is normal. No respiratory distress.     Breath sounds: Normal breath sounds.  Chest:     Chest wall: Tenderness present. No deformity, swelling, crepitus or edema.       Comments: No bruising or erythema in the region noted.  No instability noted. Abdominal:     Palpations: Abdomen is soft.     Tenderness: There is abdominal tenderness. There is no guarding.    Musculoskeletal:     Cervical back: Neck supple.       Back:     Right lower leg: No edema.     Left  lower leg: No edema.     Comments: Tenderness in the region shown.  No swelling, abnormal skin color, deformity, instability.  Lymphadenopathy:     Cervical: No cervical adenopathy.  Skin:    General: Skin is warm and dry.  Neurological:     Mental Status: She is alert.     Comments: Sensation grossly intact to light touch in the lower extremities bilaterally. No saddle anesthesias. Strength 5/5 in the bilateral lower extremities. No noted gait deficit. Coordination intact.  Psychiatric:        Mood and Affect: Mood and affect normal.        Speech: Speech normal.        Behavior: Behavior normal.     ED Results / Procedures / Treatments   Labs (all labs ordered are listed, but only abnormal results are displayed) Labs Reviewed  BASIC METABOLIC PANEL - Abnormal; Notable for the following components:      Result Value   Glucose, Bld 101 (*)    BUN 5 (*)    All other components within normal limits  CBC - Abnormal; Notable for the following components:   RDW 11.2 (*)    All other components within normal limits  URINALYSIS, ROUTINE W REFLEX MICROSCOPIC - Abnormal; Notable for the following components:   APPearance HAZY (*)    All other components within normal limits  NOVEL CORONAVIRUS, NAA (HOSP ORDER, SEND-OUT TO REF LAB; TAT 18-24 HRS)  I-STAT BETA HCG BLOOD, ED ([facility], [facility], [facility] ONLY)  POC URINE PREG, ED  TROPONIN I (HIGH SENSITIVITY)  TROPONIN I (HIGH SENSITIVITY)    EKG EKG Interpretation  Date/Time:  Monday January 26 2020 15:11:04 EST Ventricular Rate:  87 PR Interval:  158 QRS Duration: 82 QT Interval:  340 QTC Calculation: 409 R Axis:   93 Text Interpretation: Normal sinus rhythm Rightward axis Borderline ECG Confirmed by Vanetta MuldersZackowski, Scott [id number]) on 01/26/2020 9:23:39 PM   Radiology DG Chest 2 View  Result Date: 01/26/2020 CLINICAL DATA:  Chest pain. EXAM: CHEST - 2 VIEW COMPARISON:  November 06, 2018 FINDINGS: The heart size and mediastinal contours are  within normal limits. Both lungs are clear. The visualized skeletal structures are unremarkable. IMPRESSION: No active cardiopulmonary disease. Electronically Signed   By: Aram Candela M.D.   On: 01/26/2020 15:46   CT ABDOMEN PELVIS W CONTRAST  Result Date: 01/26/2020 CLINICAL DATA:  Left-sided abdominal pain for several weeks EXAM: CT ABDOMEN AND PELVIS WITH CONTRAST TECHNIQUE: Multidetector CT imaging of the abdomen and pelvis was performed using the standard protocol following bolus administration of intravenous contrast. CONTRAST:  OMNIPAQUE IOHEXOL 300 MG/ML  SOLN COMPARISON:  None. FINDINGS: Lower chest: Mild left basilar atelectasis is seen. No sizable effusion is noted. Hepatobiliary: No focal liver abnormality is seen. No gallstones, gallbladder wall thickening, or biliary dilatation. Pancreas: Unremarkable. No pancreatic ductal dilatation or surrounding inflammatory changes. Spleen: Normal in size without focal abnormality. Adrenals/Urinary Tract: Adrenal glands are within normal limits. Kidneys demonstrate a normal enhancement pattern bilaterally. No calculi or obstructive changes are seen. The bladder is decompressed. Stomach/Bowel: The appendix is not well visualized. No inflammatory changes to suggest appendicitis are seen. No obstructive or inflammatory changes of the large or small bowel are noted. The stomach is within normal limits. Vascular/Lymphatic: No significant vascular findings are present. No enlarged abdominal or pelvic lymph nodes. Reproductive: Uterus and bilateral adnexa are unremarkable. Other: No abdominal wall hernia or abnormality. No abdominopelvic ascites. Musculoskeletal: No acute or significant osseous findings. IMPRESSION: Mild left basilar atelectasis. No other focal abnormality is seen. Electronically Signed   By: Alcide Clever M.D.   On: 01/26/2020 22:58    Procedures Procedures (including critical care time)  Medications Ordered in ED Medications    oxyCODONE-acetaminophen (PERCOCET/ROXICET) 5-325 MG per tablet 1 tablet (1 tablet Oral Given 01/26/20 1647)  sodium chloride 0.9 % bolus 1,000 mL (1,000 mLs Intravenous New Bag/Given 01/26/20 2112)  ondansetron (ZOFRAN) injection 4 mg (4 mg Intravenous Given 01/26/20 2113)  ketorolac (TORADOL) 30 MG/ML injection 15 mg (15 mg Intravenous Given 01/26/20 2113)  methocarbamol (ROBAXIN) tablet 500 mg (500 mg Oral Given 01/26/20 2101)  iohexol (OMNIPAQUE) 300 MG/ML solution 100 mL (100 mLs Intravenous Contrast Given 01/26/20 2229)    ED Course  I have reviewed the triage vital signs and the nursing notes.  Pertinent labs & imaging results that were available during my care of the patient were reviewed by me and considered in my medical decision making (see chart for details).    MDM Rules/Calculators/A&P                      Patient presents with pain and tenderness to the left chest and abdomen.  No traumatic origin. Patient is nontoxic appearing, afebrile, not tachycardic, not tachypneic, not hypotensive, maintains excellent SPO2 on room air.  Improved significantly with initial management in the ED. Low suspicion for ACS. HEART score is 0, indicating low risk for a cardiac event.  EKG without evidence of acute ischemia or pathologic/symptomatic arrhythmia. Wells criteria score is 0, indicating low risk for PE.  PERC negative. Chest x-ray without acute abnormality. The patient was given instructions for home care as well as return precautions. Patient voices understanding of these instructions, accepts the plan, and is comfortable with discharge.  Vitals:   01/26/20 2130 01/26/20 2215 01/26/20 2300 01/26/20 2345  BP:      Pulse: 82 77 82 87  Resp: 17 20 (!) 26 17  Temp:      TempSrc:      SpO2: 97% 95% 98% 96%  Weight:  Height:         Final Clinical Impression(s) / ED Diagnoses Final diagnoses:  Chest wall tenderness    Rx / DC Orders ED Discharge Orders         Ordered     methocarbamol (ROBAXIN) 500 MG tablet  2 times daily     01/26/20 2343    lidocaine (LIDODERM) 5 %  Every 24 hours     01/26/20 2343    diclofenac Sodium (VOLTAREN) 1 % GEL  4 times daily     01/26/20 2343    HYDROcodone-acetaminophen (NORCO/VICODIN) 5-325 MG tablet  Every 6 hours PRN     01/26/20 2346           Anselm Pancoast, PA-C 01/27/20 0005    Vanetta Mulders, MD 02/01/20 [phone]

## 2020-01-26 NOTE — Discharge Instructions (Addendum)
  Expect your soreness to increase over the next 2-3 days. Take it easy, but do not lay around too much as this may make any stiffness worse.  Antiinflammatory medications: Take 600 mg of ibuprofen every 6 hours or 440 mg (over the counter dose) to 500 mg (prescription dose) of naproxen every 12 hours for the next 3 days. After this time, these medications may be used as needed for pain. Take these medications with food to avoid upset stomach. Choose only one of these medications, do not take them together. Acetaminophen (generic for Tylenol): Should you continue to have additional pain while taking the ibuprofen or naproxen, you may add in acetaminophen as needed. Your daily total maximum amount of acetaminophen from all sources should be limited to 4000mg /day for persons without liver problems, or 2000mg /day for those with liver problems. Vicodin: May take Vicodin (hydrocodone-acetaminophen) as needed for severe pain.   Do not drive or perform other dangerous activities while taking this medication as it can cause drowsiness as well as changes in reaction time and judgement.   Please note that each pill of Vicodin contains 325 mg of acetaminophen (generic for Tylenol) and the above dosage limits apply. Methocarbamol: Methocarbamol (generic for Robaxin) is a muscle relaxer and can help relieve stiff muscles or muscle spasms.  Do not drive or perform other dangerous activities while taking this medication as it can cause drowsiness as well as changes in reaction time and judgement. Lidocaine patches: These are available via either prescription or over-the-counter. The over-the-counter option may be more economical one and are likely just as effective. There are multiple over-the-counter brands, such as Salonpas. Diclofenac gel: This is a topical anti-inflammatory medication and can be applied directly to the painful region.  Do not use on the face or genitals.  This medication may be used as an alternative  to oral anti-inflammatory medications, such as ibuprofen or naproxen. Ice: May apply ice to the area over the next 24 hours for 15 minutes at a time to reduce pain, inflammation, and swelling, if present. Follow up: Follow up with a primary care provider for any future management of these complaints.  Return: Return to the ED should symptoms worsen.  For prescription assistance, may try using prescription discount sites or apps, such as goodrx.com

## 2020-01-28 LAB — NOVEL CORONAVIRUS, NAA (HOSP ORDER, SEND-OUT TO REF LAB; TAT 18-24 HRS): SARS-CoV-2, NAA: NOT DETECTED

## 2024-11-07 ENCOUNTER — Emergency Department (HOSPITAL_COMMUNITY): Admission: EM | Admit: 2024-11-07 | Discharge: 2024-11-07 | Discharge status: Against Medical Advice | Payer: Self-pay

## 2024-11-07 DIAGNOSIS — Z5321 Procedure and treatment not carried out due to patient leaving prior to being seen by health care provider: Secondary | ICD-10-CM | POA: Insufficient documentation

## 2024-11-07 DIAGNOSIS — R059 Cough, unspecified: Secondary | ICD-10-CM | POA: Insufficient documentation

## 2024-11-07 LAB — RESP PANEL BY RT-PCR (RSV, FLU A&B, COVID)  RVPGX2
Influenza A by PCR: NEGATIVE
Influenza B by PCR: NEGATIVE
Resp Syncytial Virus by PCR: NEGATIVE
SARS Coronavirus 2 by RT PCR: NEGATIVE

## 2024-11-07 NOTE — ED Triage Notes (Signed)
 BIB EMS from home for cough/flu like symptoms for the last 3 days. CBG 107.
